# Patient Record
Sex: Male | Born: 1950 | Race: White | Hispanic: No | Marital: Married | Smoking: Never smoker
Health system: Southern US, Community
[De-identification: ages and names within clinical notes are randomized; demographics above are authoritative.]

## PROBLEM LIST (undated history)

## (undated) DIAGNOSIS — M1990 Unspecified osteoarthritis, unspecified site: Secondary | ICD-10-CM

## (undated) DIAGNOSIS — C61 Malignant neoplasm of prostate: Secondary | ICD-10-CM

## (undated) HISTORY — DX: Malignant neoplasm of prostate: C61

## (undated) HISTORY — DX: Unspecified osteoarthritis, unspecified site: M19.90

---

## 2016-02-08 DIAGNOSIS — Z23 Encounter for immunization: Secondary | ICD-10-CM | POA: Diagnosis not present

## 2016-03-07 DIAGNOSIS — L309 Dermatitis, unspecified: Secondary | ICD-10-CM | POA: Diagnosis not present

## 2016-03-07 DIAGNOSIS — L570 Actinic keratosis: Secondary | ICD-10-CM | POA: Diagnosis not present

## 2016-03-07 DIAGNOSIS — L578 Other skin changes due to chronic exposure to nonionizing radiation: Secondary | ICD-10-CM | POA: Diagnosis not present

## 2016-03-07 DIAGNOSIS — D045 Carcinoma in situ of skin of trunk: Secondary | ICD-10-CM | POA: Diagnosis not present

## 2016-03-25 DIAGNOSIS — Z86010 Personal history of colonic polyps: Secondary | ICD-10-CM | POA: Diagnosis not present

## 2016-04-24 DIAGNOSIS — D125 Benign neoplasm of sigmoid colon: Secondary | ICD-10-CM | POA: Diagnosis not present

## 2016-04-24 DIAGNOSIS — Z538 Procedure and treatment not carried out for other reasons: Secondary | ICD-10-CM | POA: Diagnosis not present

## 2016-04-24 DIAGNOSIS — Z87891 Personal history of nicotine dependence: Secondary | ICD-10-CM | POA: Diagnosis not present

## 2016-04-24 DIAGNOSIS — Z7982 Long term (current) use of aspirin: Secondary | ICD-10-CM | POA: Diagnosis not present

## 2016-04-24 DIAGNOSIS — Z79899 Other long term (current) drug therapy: Secondary | ICD-10-CM | POA: Diagnosis not present

## 2016-04-24 DIAGNOSIS — I10 Essential (primary) hypertension: Secondary | ICD-10-CM | POA: Diagnosis not present

## 2016-04-24 DIAGNOSIS — K635 Polyp of colon: Secondary | ICD-10-CM | POA: Diagnosis not present

## 2016-04-24 DIAGNOSIS — K6389 Other specified diseases of intestine: Secondary | ICD-10-CM | POA: Diagnosis not present

## 2016-04-24 DIAGNOSIS — D124 Benign neoplasm of descending colon: Secondary | ICD-10-CM | POA: Diagnosis not present

## 2016-04-24 DIAGNOSIS — Z1211 Encounter for screening for malignant neoplasm of colon: Secondary | ICD-10-CM | POA: Diagnosis not present

## 2016-05-06 DIAGNOSIS — D126 Benign neoplasm of colon, unspecified: Secondary | ICD-10-CM | POA: Diagnosis not present

## 2016-06-27 DIAGNOSIS — L570 Actinic keratosis: Secondary | ICD-10-CM | POA: Diagnosis not present

## 2016-07-02 DIAGNOSIS — I10 Essential (primary) hypertension: Secondary | ICD-10-CM | POA: Diagnosis not present

## 2016-07-02 DIAGNOSIS — Z125 Encounter for screening for malignant neoplasm of prostate: Secondary | ICD-10-CM | POA: Diagnosis not present

## 2016-07-02 DIAGNOSIS — E785 Hyperlipidemia, unspecified: Secondary | ICD-10-CM | POA: Diagnosis not present

## 2016-07-02 DIAGNOSIS — Z131 Encounter for screening for diabetes mellitus: Secondary | ICD-10-CM | POA: Diagnosis not present

## 2016-07-02 DIAGNOSIS — M159 Polyosteoarthritis, unspecified: Secondary | ICD-10-CM | POA: Diagnosis not present

## 2017-01-08 DIAGNOSIS — Z23 Encounter for immunization: Secondary | ICD-10-CM | POA: Diagnosis not present

## 2017-01-08 DIAGNOSIS — I10 Essential (primary) hypertension: Secondary | ICD-10-CM | POA: Diagnosis not present

## 2017-01-08 DIAGNOSIS — E785 Hyperlipidemia, unspecified: Secondary | ICD-10-CM | POA: Diagnosis not present

## 2017-01-26 DIAGNOSIS — L821 Other seborrheic keratosis: Secondary | ICD-10-CM | POA: Diagnosis not present

## 2017-01-26 DIAGNOSIS — Z85828 Personal history of other malignant neoplasm of skin: Secondary | ICD-10-CM | POA: Diagnosis not present

## 2017-01-26 DIAGNOSIS — L309 Dermatitis, unspecified: Secondary | ICD-10-CM | POA: Diagnosis not present

## 2017-01-26 DIAGNOSIS — L578 Other skin changes due to chronic exposure to nonionizing radiation: Secondary | ICD-10-CM | POA: Diagnosis not present

## 2017-01-26 DIAGNOSIS — L570 Actinic keratosis: Secondary | ICD-10-CM | POA: Diagnosis not present

## 2017-01-26 DIAGNOSIS — D225 Melanocytic nevi of trunk: Secondary | ICD-10-CM | POA: Diagnosis not present

## 2017-01-26 DIAGNOSIS — Z08 Encounter for follow-up examination after completed treatment for malignant neoplasm: Secondary | ICD-10-CM | POA: Diagnosis not present

## 2017-01-26 DIAGNOSIS — D485 Neoplasm of uncertain behavior of skin: Secondary | ICD-10-CM | POA: Diagnosis not present

## 2017-07-07 DIAGNOSIS — Z23 Encounter for immunization: Secondary | ICD-10-CM | POA: Diagnosis not present

## 2017-07-07 DIAGNOSIS — E785 Hyperlipidemia, unspecified: Secondary | ICD-10-CM | POA: Diagnosis not present

## 2017-07-07 DIAGNOSIS — I10 Essential (primary) hypertension: Secondary | ICD-10-CM | POA: Diagnosis not present

## 2017-07-23 DIAGNOSIS — E785 Hyperlipidemia, unspecified: Secondary | ICD-10-CM | POA: Diagnosis not present

## 2017-07-23 DIAGNOSIS — I10 Essential (primary) hypertension: Secondary | ICD-10-CM | POA: Diagnosis not present

## 2017-07-23 DIAGNOSIS — Z23 Encounter for immunization: Secondary | ICD-10-CM | POA: Diagnosis not present

## 2017-07-27 DIAGNOSIS — D225 Melanocytic nevi of trunk: Secondary | ICD-10-CM | POA: Diagnosis not present

## 2017-07-27 DIAGNOSIS — L578 Other skin changes due to chronic exposure to nonionizing radiation: Secondary | ICD-10-CM | POA: Diagnosis not present

## 2017-07-27 DIAGNOSIS — D234 Other benign neoplasm of skin of scalp and neck: Secondary | ICD-10-CM | POA: Diagnosis not present

## 2017-07-27 DIAGNOSIS — D485 Neoplasm of uncertain behavior of skin: Secondary | ICD-10-CM | POA: Diagnosis not present

## 2017-07-27 DIAGNOSIS — L249 Irritant contact dermatitis, unspecified cause: Secondary | ICD-10-CM | POA: Diagnosis not present

## 2017-07-27 DIAGNOSIS — Z08 Encounter for follow-up examination after completed treatment for malignant neoplasm: Secondary | ICD-10-CM | POA: Diagnosis not present

## 2017-07-27 DIAGNOSIS — Z85828 Personal history of other malignant neoplasm of skin: Secondary | ICD-10-CM | POA: Diagnosis not present

## 2018-01-26 DIAGNOSIS — M5090 Cervical disc disorder, unspecified, unspecified cervical region: Secondary | ICD-10-CM | POA: Diagnosis not present

## 2018-01-26 DIAGNOSIS — M1990 Unspecified osteoarthritis, unspecified site: Secondary | ICD-10-CM | POA: Diagnosis not present

## 2018-01-26 DIAGNOSIS — I10 Essential (primary) hypertension: Secondary | ICD-10-CM | POA: Diagnosis not present

## 2018-01-26 DIAGNOSIS — M2550 Pain in unspecified joint: Secondary | ICD-10-CM | POA: Diagnosis not present

## 2018-01-26 DIAGNOSIS — Z1389 Encounter for screening for other disorder: Secondary | ICD-10-CM | POA: Diagnosis not present

## 2018-01-26 DIAGNOSIS — E7800 Pure hypercholesterolemia, unspecified: Secondary | ICD-10-CM | POA: Diagnosis not present

## 2018-01-26 DIAGNOSIS — K649 Unspecified hemorrhoids: Secondary | ICD-10-CM | POA: Diagnosis not present

## 2018-01-26 DIAGNOSIS — Z23 Encounter for immunization: Secondary | ICD-10-CM | POA: Diagnosis not present

## 2018-02-02 DIAGNOSIS — H53032 Strabismic amblyopia, left eye: Secondary | ICD-10-CM | POA: Diagnosis not present

## 2018-02-02 DIAGNOSIS — H2513 Age-related nuclear cataract, bilateral: Secondary | ICD-10-CM | POA: Diagnosis not present

## 2018-02-02 DIAGNOSIS — H02413 Mechanical ptosis of bilateral eyelids: Secondary | ICD-10-CM | POA: Diagnosis not present

## 2018-02-02 DIAGNOSIS — H35363 Drusen (degenerative) of macula, bilateral: Secondary | ICD-10-CM | POA: Diagnosis not present

## 2018-02-04 DIAGNOSIS — Z6828 Body mass index (BMI) 28.0-28.9, adult: Secondary | ICD-10-CM | POA: Diagnosis not present

## 2018-02-04 DIAGNOSIS — M150 Primary generalized (osteo)arthritis: Secondary | ICD-10-CM | POA: Diagnosis not present

## 2018-02-04 DIAGNOSIS — E663 Overweight: Secondary | ICD-10-CM | POA: Diagnosis not present

## 2018-02-04 DIAGNOSIS — M2550 Pain in unspecified joint: Secondary | ICD-10-CM | POA: Diagnosis not present

## 2018-02-17 DIAGNOSIS — R42 Dizziness and giddiness: Secondary | ICD-10-CM | POA: Diagnosis not present

## 2018-02-17 DIAGNOSIS — M5090 Cervical disc disorder, unspecified, unspecified cervical region: Secondary | ICD-10-CM | POA: Diagnosis not present

## 2018-02-17 DIAGNOSIS — I10 Essential (primary) hypertension: Secondary | ICD-10-CM | POA: Diagnosis not present

## 2018-03-18 DIAGNOSIS — H8110 Benign paroxysmal vertigo, unspecified ear: Secondary | ICD-10-CM | POA: Diagnosis not present

## 2018-03-18 DIAGNOSIS — I10 Essential (primary) hypertension: Secondary | ICD-10-CM | POA: Diagnosis not present

## 2018-06-15 DIAGNOSIS — M5090 Cervical disc disorder, unspecified, unspecified cervical region: Secondary | ICD-10-CM | POA: Diagnosis not present

## 2018-06-15 DIAGNOSIS — K649 Unspecified hemorrhoids: Secondary | ICD-10-CM | POA: Diagnosis not present

## 2018-06-15 DIAGNOSIS — I10 Essential (primary) hypertension: Secondary | ICD-10-CM | POA: Diagnosis not present

## 2018-06-15 DIAGNOSIS — Z0000 Encounter for general adult medical examination without abnormal findings: Secondary | ICD-10-CM | POA: Diagnosis not present

## 2018-06-15 DIAGNOSIS — M1990 Unspecified osteoarthritis, unspecified site: Secondary | ICD-10-CM | POA: Diagnosis not present

## 2018-06-15 DIAGNOSIS — Z125 Encounter for screening for malignant neoplasm of prostate: Secondary | ICD-10-CM | POA: Diagnosis not present

## 2018-06-15 DIAGNOSIS — E7800 Pure hypercholesterolemia, unspecified: Secondary | ICD-10-CM | POA: Diagnosis not present

## 2018-06-15 DIAGNOSIS — Z1159 Encounter for screening for other viral diseases: Secondary | ICD-10-CM | POA: Diagnosis not present

## 2018-06-29 DIAGNOSIS — M2550 Pain in unspecified joint: Secondary | ICD-10-CM | POA: Diagnosis not present

## 2018-06-29 DIAGNOSIS — Z6829 Body mass index (BMI) 29.0-29.9, adult: Secondary | ICD-10-CM | POA: Diagnosis not present

## 2018-06-29 DIAGNOSIS — M150 Primary generalized (osteo)arthritis: Secondary | ICD-10-CM | POA: Diagnosis not present

## 2018-06-29 DIAGNOSIS — E663 Overweight: Secondary | ICD-10-CM | POA: Diagnosis not present

## 2018-06-29 DIAGNOSIS — R899 Unspecified abnormal finding in specimens from other organs, systems and tissues: Secondary | ICD-10-CM | POA: Diagnosis not present

## 2018-10-08 DIAGNOSIS — D485 Neoplasm of uncertain behavior of skin: Secondary | ICD-10-CM | POA: Diagnosis not present

## 2018-10-08 DIAGNOSIS — L309 Dermatitis, unspecified: Secondary | ICD-10-CM | POA: Diagnosis not present

## 2018-10-08 DIAGNOSIS — L570 Actinic keratosis: Secondary | ICD-10-CM | POA: Diagnosis not present

## 2018-10-08 DIAGNOSIS — Z85828 Personal history of other malignant neoplasm of skin: Secondary | ICD-10-CM | POA: Diagnosis not present

## 2018-10-08 DIAGNOSIS — L814 Other melanin hyperpigmentation: Secondary | ICD-10-CM | POA: Diagnosis not present

## 2018-10-08 DIAGNOSIS — C44519 Basal cell carcinoma of skin of other part of trunk: Secondary | ICD-10-CM | POA: Diagnosis not present

## 2018-10-08 DIAGNOSIS — D045 Carcinoma in situ of skin of trunk: Secondary | ICD-10-CM | POA: Diagnosis not present

## 2018-10-08 DIAGNOSIS — L821 Other seborrheic keratosis: Secondary | ICD-10-CM | POA: Diagnosis not present

## 2018-11-30 DIAGNOSIS — C44519 Basal cell carcinoma of skin of other part of trunk: Secondary | ICD-10-CM | POA: Diagnosis not present

## 2018-11-30 DIAGNOSIS — L988 Other specified disorders of the skin and subcutaneous tissue: Secondary | ICD-10-CM | POA: Diagnosis not present

## 2018-12-08 DIAGNOSIS — Z23 Encounter for immunization: Secondary | ICD-10-CM | POA: Diagnosis not present

## 2018-12-15 DIAGNOSIS — M549 Dorsalgia, unspecified: Secondary | ICD-10-CM | POA: Diagnosis not present

## 2018-12-15 DIAGNOSIS — E7800 Pure hypercholesterolemia, unspecified: Secondary | ICD-10-CM | POA: Diagnosis not present

## 2018-12-15 DIAGNOSIS — Z23 Encounter for immunization: Secondary | ICD-10-CM | POA: Diagnosis not present

## 2018-12-15 DIAGNOSIS — I10 Essential (primary) hypertension: Secondary | ICD-10-CM | POA: Diagnosis not present

## 2019-01-26 DIAGNOSIS — C44222 Squamous cell carcinoma of skin of right ear and external auricular canal: Secondary | ICD-10-CM | POA: Diagnosis not present

## 2019-01-26 DIAGNOSIS — D485 Neoplasm of uncertain behavior of skin: Secondary | ICD-10-CM | POA: Diagnosis not present

## 2019-01-26 DIAGNOSIS — L821 Other seborrheic keratosis: Secondary | ICD-10-CM | POA: Diagnosis not present

## 2019-01-26 DIAGNOSIS — Z86007 Personal history of in-situ neoplasm of skin: Secondary | ICD-10-CM | POA: Diagnosis not present

## 2019-01-26 DIAGNOSIS — L570 Actinic keratosis: Secondary | ICD-10-CM | POA: Diagnosis not present

## 2019-01-26 DIAGNOSIS — Z23 Encounter for immunization: Secondary | ICD-10-CM | POA: Diagnosis not present

## 2019-02-07 DIAGNOSIS — H2513 Age-related nuclear cataract, bilateral: Secondary | ICD-10-CM | POA: Diagnosis not present

## 2019-02-07 DIAGNOSIS — H35363 Drusen (degenerative) of macula, bilateral: Secondary | ICD-10-CM | POA: Diagnosis not present

## 2019-02-07 DIAGNOSIS — H35033 Hypertensive retinopathy, bilateral: Secondary | ICD-10-CM | POA: Diagnosis not present

## 2019-02-07 DIAGNOSIS — H02831 Dermatochalasis of right upper eyelid: Secondary | ICD-10-CM | POA: Diagnosis not present

## 2019-02-18 DIAGNOSIS — H57813 Brow ptosis, bilateral: Secondary | ICD-10-CM | POA: Diagnosis not present

## 2019-02-18 DIAGNOSIS — H02834 Dermatochalasis of left upper eyelid: Secondary | ICD-10-CM | POA: Diagnosis not present

## 2019-02-18 DIAGNOSIS — H02413 Mechanical ptosis of bilateral eyelids: Secondary | ICD-10-CM | POA: Diagnosis not present

## 2019-02-18 DIAGNOSIS — H0279 Other degenerative disorders of eyelid and periocular area: Secondary | ICD-10-CM | POA: Diagnosis not present

## 2019-02-18 DIAGNOSIS — H53483 Generalized contraction of visual field, bilateral: Secondary | ICD-10-CM | POA: Diagnosis not present

## 2019-02-18 DIAGNOSIS — L570 Actinic keratosis: Secondary | ICD-10-CM | POA: Diagnosis not present

## 2019-02-18 DIAGNOSIS — H02831 Dermatochalasis of right upper eyelid: Secondary | ICD-10-CM | POA: Diagnosis not present

## 2019-03-10 DIAGNOSIS — L814 Other melanin hyperpigmentation: Secondary | ICD-10-CM | POA: Diagnosis not present

## 2019-03-10 DIAGNOSIS — L578 Other skin changes due to chronic exposure to nonionizing radiation: Secondary | ICD-10-CM | POA: Diagnosis not present

## 2019-03-10 DIAGNOSIS — Z85828 Personal history of other malignant neoplasm of skin: Secondary | ICD-10-CM | POA: Diagnosis not present

## 2019-03-10 DIAGNOSIS — D1801 Hemangioma of skin and subcutaneous tissue: Secondary | ICD-10-CM | POA: Diagnosis not present

## 2019-03-10 DIAGNOSIS — L111 Transient acantholytic dermatosis [Grover]: Secondary | ICD-10-CM | POA: Diagnosis not present

## 2019-03-10 DIAGNOSIS — C44222 Squamous cell carcinoma of skin of right ear and external auricular canal: Secondary | ICD-10-CM | POA: Diagnosis not present

## 2019-03-10 DIAGNOSIS — D239 Other benign neoplasm of skin, unspecified: Secondary | ICD-10-CM | POA: Diagnosis not present

## 2019-03-10 DIAGNOSIS — L821 Other seborrheic keratosis: Secondary | ICD-10-CM | POA: Diagnosis not present

## 2019-03-10 DIAGNOSIS — L570 Actinic keratosis: Secondary | ICD-10-CM | POA: Diagnosis not present

## 2019-03-10 DIAGNOSIS — C44629 Squamous cell carcinoma of skin of left upper limb, including shoulder: Secondary | ICD-10-CM | POA: Diagnosis not present

## 2019-03-10 DIAGNOSIS — D485 Neoplasm of uncertain behavior of skin: Secondary | ICD-10-CM | POA: Diagnosis not present

## 2019-03-11 DIAGNOSIS — Z23 Encounter for immunization: Secondary | ICD-10-CM | POA: Diagnosis not present

## 2019-04-08 DIAGNOSIS — Z23 Encounter for immunization: Secondary | ICD-10-CM | POA: Diagnosis not present

## 2019-04-13 DIAGNOSIS — H02834 Dermatochalasis of left upper eyelid: Secondary | ICD-10-CM | POA: Diagnosis not present

## 2019-04-13 DIAGNOSIS — H0279 Other degenerative disorders of eyelid and periocular area: Secondary | ICD-10-CM | POA: Diagnosis not present

## 2019-04-13 DIAGNOSIS — H02413 Mechanical ptosis of bilateral eyelids: Secondary | ICD-10-CM | POA: Diagnosis not present

## 2019-04-13 DIAGNOSIS — H57813 Brow ptosis, bilateral: Secondary | ICD-10-CM | POA: Diagnosis not present

## 2019-04-13 DIAGNOSIS — H53483 Generalized contraction of visual field, bilateral: Secondary | ICD-10-CM | POA: Diagnosis not present

## 2019-04-13 DIAGNOSIS — H02831 Dermatochalasis of right upper eyelid: Secondary | ICD-10-CM | POA: Diagnosis not present

## 2019-04-13 DIAGNOSIS — L570 Actinic keratosis: Secondary | ICD-10-CM | POA: Diagnosis not present

## 2019-04-20 DIAGNOSIS — C44222 Squamous cell carcinoma of skin of right ear and external auricular canal: Secondary | ICD-10-CM | POA: Diagnosis not present

## 2019-04-21 ENCOUNTER — Ambulatory Visit (INDEPENDENT_AMBULATORY_CARE_PROVIDER_SITE_OTHER): Payer: Medicare Other

## 2019-04-21 ENCOUNTER — Other Ambulatory Visit: Payer: Self-pay

## 2019-04-21 ENCOUNTER — Ambulatory Visit (INDEPENDENT_AMBULATORY_CARE_PROVIDER_SITE_OTHER): Payer: Medicare Other | Admitting: Orthopaedic Surgery

## 2019-04-21 ENCOUNTER — Encounter: Payer: Self-pay | Admitting: Orthopaedic Surgery

## 2019-04-21 DIAGNOSIS — M25551 Pain in right hip: Secondary | ICD-10-CM

## 2019-04-21 DIAGNOSIS — G8929 Other chronic pain: Secondary | ICD-10-CM | POA: Diagnosis not present

## 2019-04-21 DIAGNOSIS — M25562 Pain in left knee: Secondary | ICD-10-CM

## 2019-04-21 DIAGNOSIS — M25552 Pain in left hip: Secondary | ICD-10-CM | POA: Diagnosis not present

## 2019-04-21 MED ORDER — LIDOCAINE HCL 1 % IJ SOLN
3.0000 mL | INTRAMUSCULAR | Status: AC | PRN
  Administered 2019-04-21: 3 mL

## 2019-04-21 MED ORDER — METHYLPREDNISOLONE ACETATE 40 MG/ML IJ SUSP
40.0000 mg | INTRAMUSCULAR | Status: AC | PRN
  Administered 2019-04-21: 40 mg via INTRA_ARTICULAR

## 2019-04-21 NOTE — Progress Notes (Signed)
Office Visit Note   Patient: Christian Brooks           Date of Birth: 1950/09/27           MRN: [id number] Visit Date: 04/21/2019              Requested by: No referring provider defined for this encounter. PCP: Patient, No Pcp Per   Assessment & Plan: Visit Diagnoses:  1. Chronic pain of left knee   2. Bilateral hip pain     Plan: We talked about his hips and left knee in detail.  I do feel it is worthwhile trying a steroid injection of left knee the could hopefully be a diagnostic and therapeutic injection.  He agrees with this treatment plan.  My goal would be to decrease his pain and improve the function of that knee.  If the injection works it will be great.  If it works but then he has residual pain it would warrant a MRI of his left knee to assess the cartilage and look for any meniscal tearing based on his clinical exam findings.  As far as his hips goes, right now would not recommend anything else but will consider intra-articular injections in the hips at some point if needed.  He agrees with this treatment plan.  All questions and concerns were answered addressed.  He did tolerate the steroid injection well in his left knee.  We will see how he does with this and will see him back in 2 weeks.  Follow-Up Instructions: Return in about 2 weeks (around 05/05/2019).   Orders:  Orders Placed This Encounter  Procedures  . Large Joint Inj: L knee  . XR HIPS BILAT W OR W/O PELVIS 3-4 VIEWS  . XR KNEE 3 VIEW LEFT   No orders of the defined types were placed in this encounter.     Procedures: Large Joint Inj: L knee on 04/21/2019 3:41 PM Indications: diagnostic evaluation and pain Details: 22 G 1.5 in needle, superolateral approach  Arthrogram: No  Medications: 3 mL lidocaine 1 %; 40 mg methylPREDNISolone acetate 40 MG/ML Outcome: tolerated well, no immediate complications Procedure, treatment alternatives, risks and benefits explained, specific risks discussed. Consent was  given by the patient. Immediately prior to procedure a time out was called to verify the correct patient, procedure, equipment, support staff and site/side marked as required. Patient was prepped and draped in the usual sterile fashion.       Clinical Data: No additional findings.   Subjective: Chief Complaint  Patient presents with  . Left Knee - Pain  The patient is a very pleasant and active 69 year old gentleman who has recently moved to [place] from [place] this past October.  He used to be very active but with COVID-19 pandemic he has not been able to get back into activities such as working on a gym.  He comes in with chief complaint of bilateral hip pain and left knee pain.  He does have a remote history of at least 2 different surgeries on his right knee but no surgery or injections in the left knee.  He states his left knee does have a lot of cracking and popping any points to the posterior medial aspect of his left knee is where he hurts the most.  Certain pivoting activities with that left knee does cause significant pain.  He also gets a lot of swelling in his left knee and he has difficulty putting his shoes and socks  on on the left side.  He also points to both his hips as a source of pain with right worse than left and he says is somewhat in the groin but he really points to the superior lateral aspect of both hips as a source of his pain.  He says is worse when he standing after sitting for a while.  This is true with his knee as well.  He is not a diabetic and not a smoker and otherwise very active individual that is frustrated at the stiffness and pain that he is getting.  HPI  Review of Systems He currently denies any headache, chest pain, shortness of breath, fever, chills, nausea, vomiting  Objective: Vital Signs: There were no vitals taken for this visit.  Physical Exam He is alert and orient x3 and in no acute distress Ortho Exam Examination of both his hip  shows full and fluid range of motion of both hips with no blocks to rotation and no pain in the groin today with either hip and no pain to palpation of the trochanteric area with either hip.  Examination of his left knee today shows no effusion.  There is pain to the medial and posterior medial aspect of the left knee and stressing and rotating the tibia on the femur does cause pain in this area.  There is some slight patellofemoral crepitation but good range of motion of the knee.  His Lachman's exam is negative. Specialty Comments:  No specialty comments available.  Imaging: XR HIPS BILAT W OR W/O PELVIS 3-4 VIEWS  Result Date: 04/21/2019 A low AP pelvis and lateral of both hips shows no significant arthritic changes or acute findings.  The femoral heads are nice and round of the joint space is well-maintained with both hips.  XR KNEE 3 VIEW LEFT  Result Date: 04/21/2019 3 views of the left knee shows well-maintained medial lateral compartments.  There is mild to moderate patellofemoral arthritic changes.  There is no malalignment and no effusion.  There is otherwise no acute findings.    PMFS History: There are no problems to display for this patient.  History reviewed. No pertinent past medical history.  History reviewed. No pertinent family history.  History reviewed. No pertinent surgical history. Social History   Occupational History  . Not on file  Tobacco Use  . Smoking status: Never Smoker  . Smokeless tobacco: Never Used  Substance and Sexual Activity  . Alcohol use: Not on file  . Drug use: Not on file  . Sexual activity: Not on file

## 2019-04-26 DIAGNOSIS — C44629 Squamous cell carcinoma of skin of left upper limb, including shoulder: Secondary | ICD-10-CM | POA: Diagnosis not present

## 2019-04-26 DIAGNOSIS — L821 Other seborrheic keratosis: Secondary | ICD-10-CM | POA: Diagnosis not present

## 2019-05-05 ENCOUNTER — Encounter: Payer: Self-pay | Admitting: Orthopaedic Surgery

## 2019-05-05 ENCOUNTER — Other Ambulatory Visit: Payer: Self-pay

## 2019-05-05 ENCOUNTER — Ambulatory Visit (INDEPENDENT_AMBULATORY_CARE_PROVIDER_SITE_OTHER): Payer: Medicare Other | Admitting: Orthopaedic Surgery

## 2019-05-05 DIAGNOSIS — M25562 Pain in left knee: Secondary | ICD-10-CM

## 2019-05-05 DIAGNOSIS — G8929 Other chronic pain: Secondary | ICD-10-CM

## 2019-05-05 NOTE — Progress Notes (Signed)
The patient is a 69 year old gentleman I am seeing in follow-up after placing a steroid injection in his left knee due to acute knee issues.  He was having a lot of pain in the back of his knee with pivoting activities.  It is not hurt going up and down stairs.  It does occasionally wake him up at night if he had his knee bent twisted a different way.  It hurts when he is getting out of a car.  He is very guarded now with his knee.  Its only the posterior medial lateral aspect of his knee where it hurts quite a bit.  The steroid injection did calm down his pain quite a bit until just recently when is hurting again.  Even today he is visibly walking with a limp.  He has never had surgery on his left knee before.  Previous x-rays of his left knee at his last visit show well-maintained medial lateral compartments with no acute findings.  Examination of his left knee today shows no effusion but he has significant pain with internal and external rotation of the tibia on the femur with a positive Murray sign to the medial compartment.  His Lachman's exam is negative.  His range of motion is full.  Given his failure conservative treatment including quad strengthening exercises, activity modification and rest, anti-inflammatories both topically and orally as well as a steroid injection, I feel is warranted we obtain an MRI of the left knee to rule out a meniscal tear.  He is also follow an exercise program involving quad strengthening.  He agrees with this treatment plan.  We will see him back once we have the MRI of his left knee and can go over this with him.

## 2019-05-19 ENCOUNTER — Ambulatory Visit: Payer: Medicare Other | Admitting: Orthopaedic Surgery

## 2019-05-28 ENCOUNTER — Ambulatory Visit
Admission: RE | Admit: 2019-05-28 | Discharge: 2019-05-28 | Disposition: A | Discharge status: Home / Self Care | Payer: Medicare Other | Source: Ambulatory Visit | Attending: Orthopaedic Surgery | Admitting: Orthopaedic Surgery

## 2019-05-28 ENCOUNTER — Other Ambulatory Visit: Payer: Self-pay

## 2019-05-28 DIAGNOSIS — G8929 Other chronic pain: Secondary | ICD-10-CM

## 2019-05-28 DIAGNOSIS — M25562 Pain in left knee: Secondary | ICD-10-CM | POA: Diagnosis not present

## 2019-05-28 IMAGING — MR MR KNEE*L* W/O CM
7 series · 38 of 40 positions shown · non-contrast
Comparison: Plain films left knee from [REDACTED] [HOSPITAL]
[DATE].

CLINICAL DATA: Left knee pain for 3 months since a twisting injury
on stairs.

EXAM:
MRI OF THE LEFT KNEE WITHOUT CONTRAST
TECHNIQUE: Multiplanar, multisequence MR imaging of the knee was performed. No
intravenous contrast was administered.

[Series 6: T2 fat-sat · axial · left · 4.0mm · 0.50mm/px · z∈[-126,+27]mm · 6 of 36 slices shown (1 of 3)]
[im 1/36]
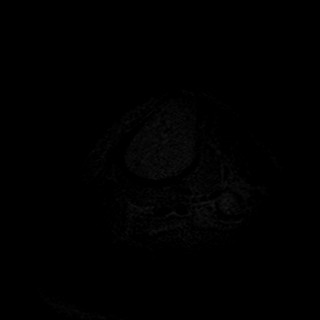
[im 8/36]
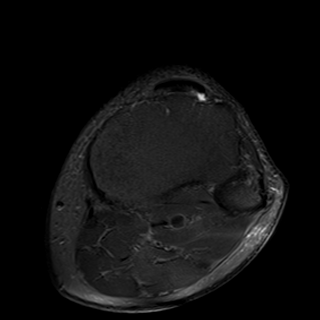
[im 15/36]
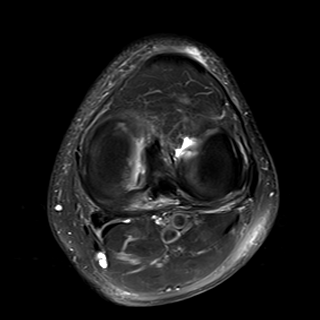
[im 22/36]
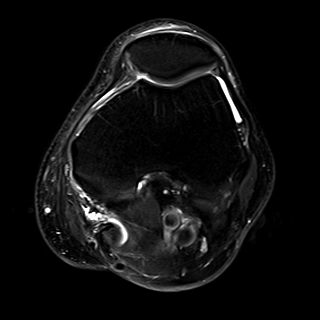
[im 29/36]
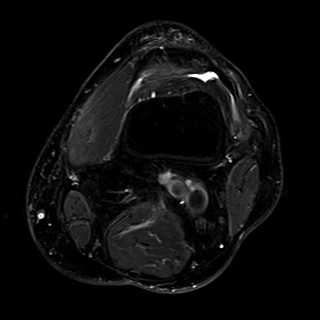
[im 36/36]
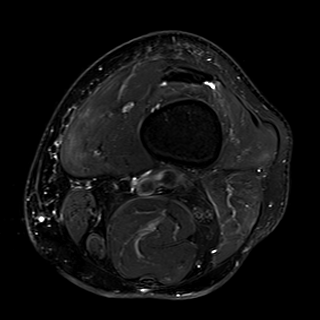

[Series 7: T2 fat-sat · coronal · left · 4.0mm · 0.37mm/px · 6 of 27 slices shown (2 of 3)]
[im 1/27]
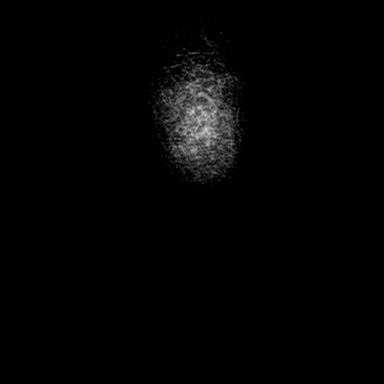
[im 6/27]
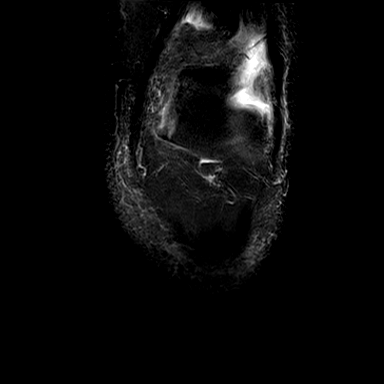
[im 11/27]
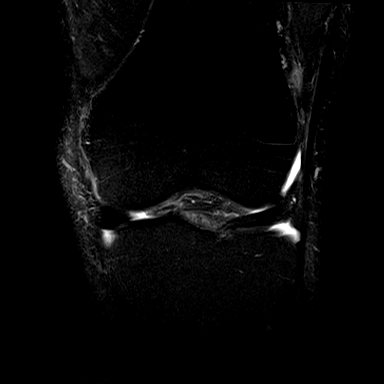
[im 16/27]
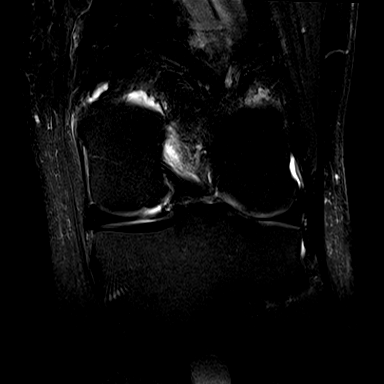
[im 21/27]
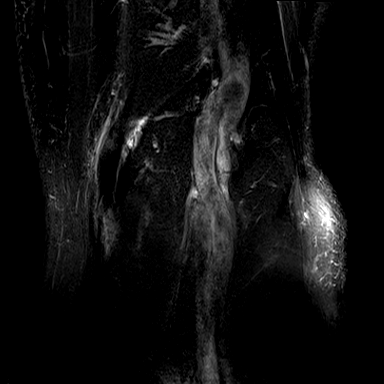
[im 27/27]
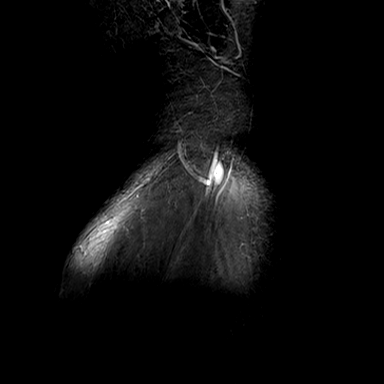

[Series 8: T1 · coronal · left · 4.0mm · 0.37mm/px · 4 of 28 slices shown]
[im 1/28]
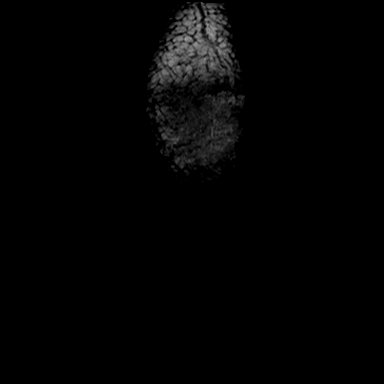
[im 6/28]
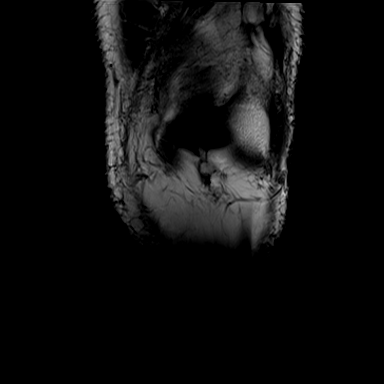
[im 11/28]
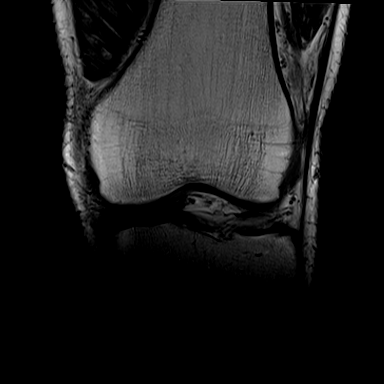
[im 17/28]
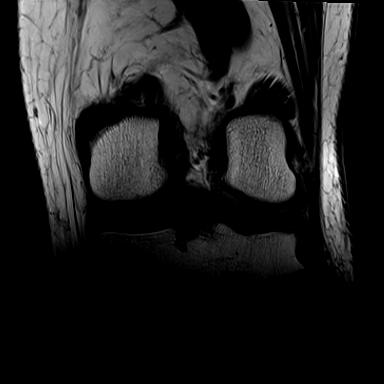

[Series 9: PD fat-sat · coronal · left · 3.0mm · 0.45mm/px · 6 of 28 slices shown (1 of 2)]
[im 1/28]
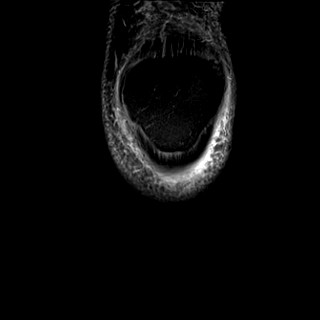
[im 6/28]
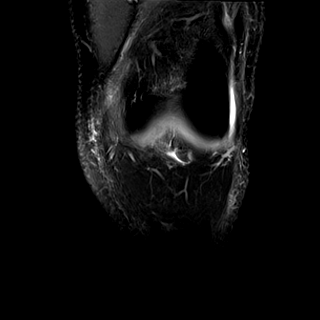
[im 11/28]
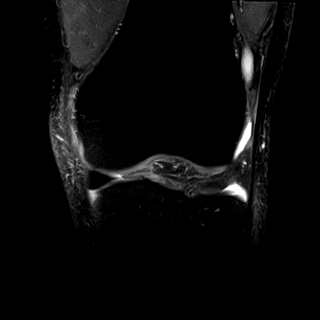
[im 17/28]
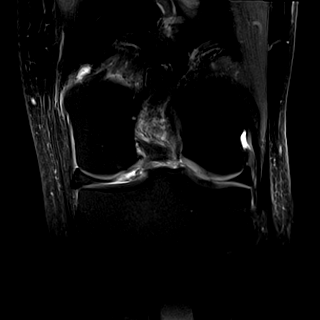
[im 22/28]
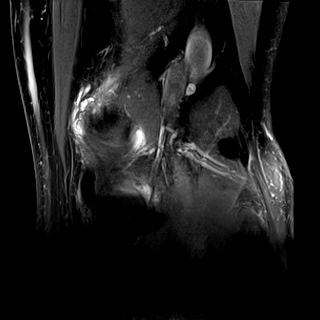
[im 28/28]
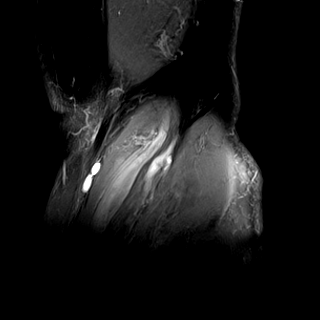

[Series 10: PD fat-sat · sagittal · left · 3.0mm · 0.39mm/px · 6 of 27 slices shown (2 of 2)]
[im 1/27]
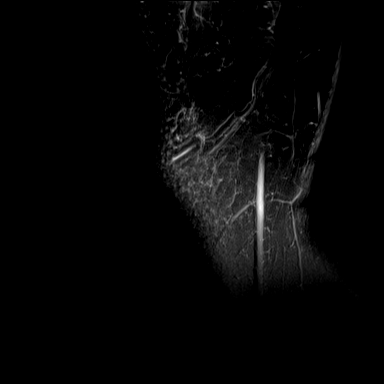
[im 6/27]
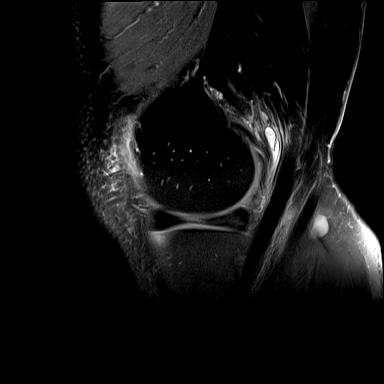
[im 11/27]
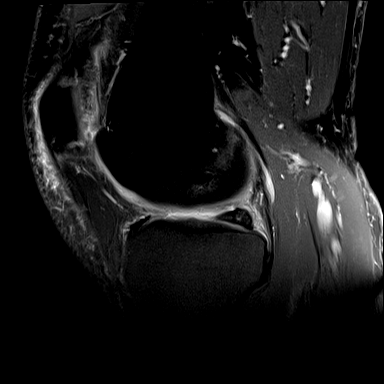
[im 16/27]
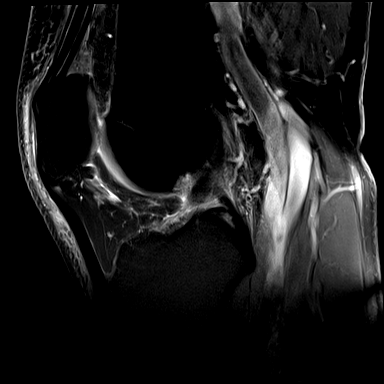
[im 21/27]
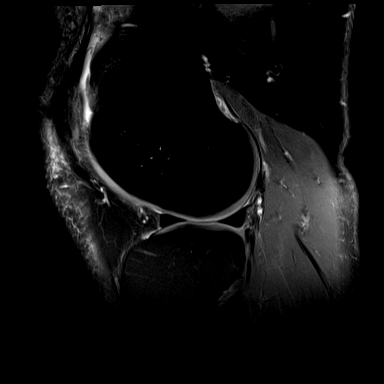
[im 27/27]
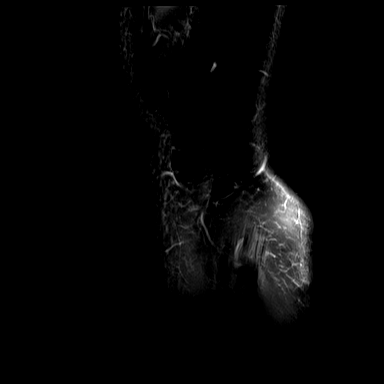

[Series 11: T2 fat-sat · sagittal · left · 3.0mm · 0.39mm/px · 6 of 27 slices shown (3 of 3)]
[im 1/27]
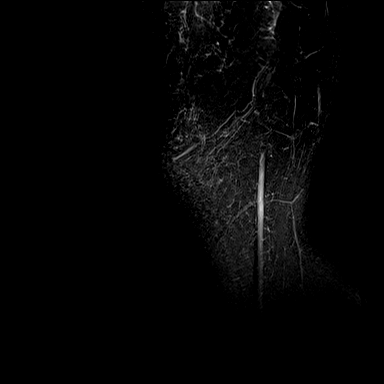
[im 6/27]
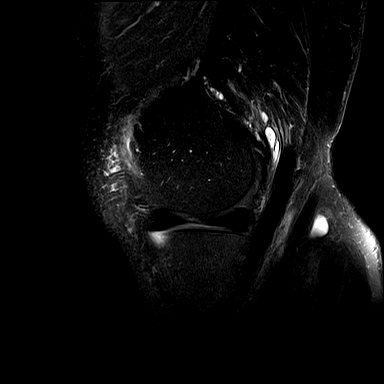
[im 11/27]
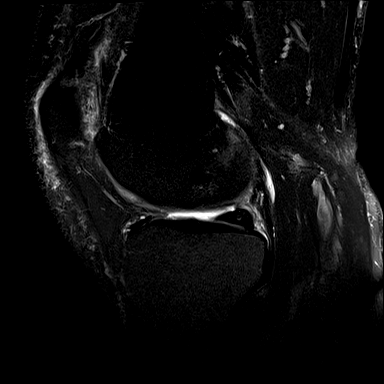
[im 16/27]
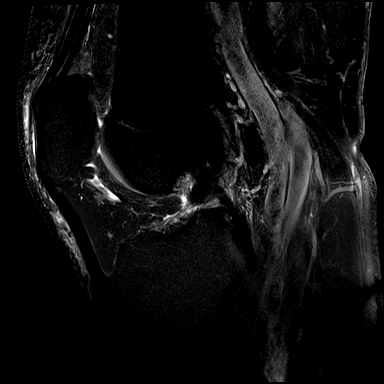
[im 21/27]
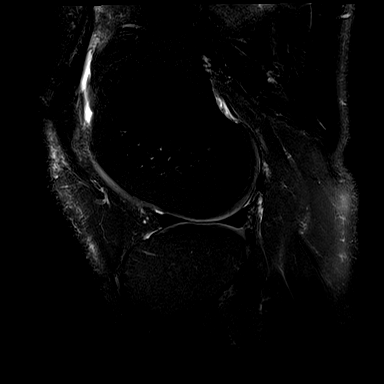
[im 27/27]
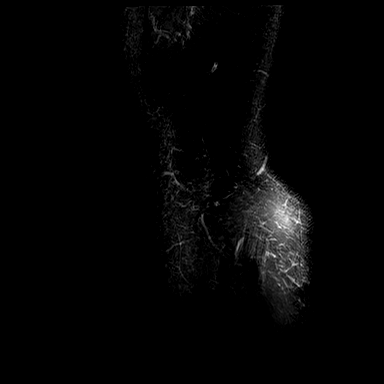

[Series 12: PD · coronal · left · 1.5mm · 0.44mm/px · 4 of 21 slices shown]
[im 1/21]
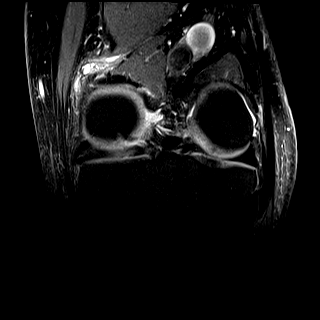
[im 7/21]
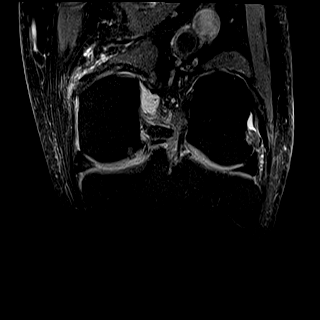
[im 14/21]
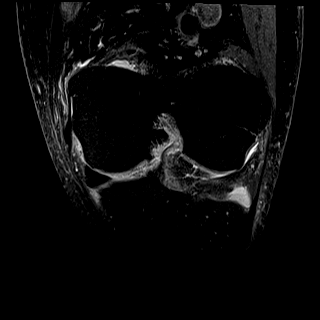
[im 21/21]
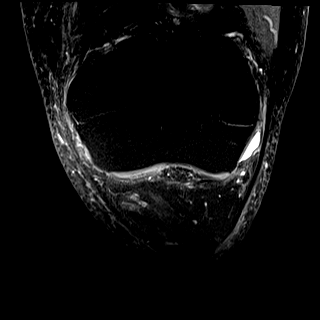

[38 of 40 positions shown; findings below may reference images not displayed]

FINDINGS: MENISCI

Medial meniscus: There is a partial radial tear through the root of
the posterior horn. The tear extends peripherally into the posterior
horn in a horizontal orientation reaching the meniscal undersurface.

Lateral meniscus:  Intact.

LIGAMENTS

Cruciates:  Intact.

Collaterals:  Intact.

CARTILAGE

Patellofemoral: Cartilage thinning is most notable along the medial
patellar facet in the superior pole.

Medial:  Mildly degenerated.

Lateral:  Minimally degenerated.

Joint:  Very small effusion.

Popliteal Fossa:  Small Baker's cyst.

Extensor Mechanism:  Intact

Bones:  No fracture, stress change or worrisome lesion

Other: None.
IMPRESSION: Tear of the posterior horn the medial meniscus as described above.

Mild medial and patellofemoral degenerative change.

## 2019-05-31 ENCOUNTER — Encounter: Payer: Self-pay | Admitting: Orthopaedic Surgery

## 2019-05-31 ENCOUNTER — Other Ambulatory Visit: Payer: Self-pay

## 2019-05-31 ENCOUNTER — Ambulatory Visit (INDEPENDENT_AMBULATORY_CARE_PROVIDER_SITE_OTHER): Payer: Medicare Other | Admitting: Orthopaedic Surgery

## 2019-05-31 DIAGNOSIS — S83242D Other tear of medial meniscus, current injury, left knee, subsequent encounter: Secondary | ICD-10-CM

## 2019-05-31 NOTE — Progress Notes (Signed)
The patient comes in today to go over MRI of his left knee.  He is a very active 69 year old could been having some knee pain with pivoting activities.  We did place a steroid injection in his knee in early April and that did help somewhat.  He then felt like he may have injured the knee again.  He does play golf and that does bother his knee.  It does feel better than it had been.  His plain films showed a well-maintained space in his left knee.  On exam today his main pain is in the medial joint line.  His McMurray's and Lachman's are negative.  There is no effusion his range of motion is full.  The knee is ligamentously stable.  The MRI does show a tear of the meniscal root with a horizontal component of the left knee medial meniscus.  He has minimal thinning of the cartilage in his knee.  The remaining structures are intact.  A long and thorough discussion about his knee and it showed him a knee model.  Right now would advocate just him strengthen the quad muscles and avoiding pivoting activities.  We can always repeat a steroid injection in July.  I did talk to him about the possibility of an arthroscopic intervention if his knee becomes more symptomatic but I would defer that for now since he is not having locking and catching.  I did explain what knee arthroscopy involves showing the knee model and we talked about that in detail.  All questions and concerns were answered addressed.  If he gets to the point where an arthroscopic intervention is warranted he will let us know.

## 2019-06-29 DIAGNOSIS — M150 Primary generalized (osteo)arthritis: Secondary | ICD-10-CM | POA: Diagnosis not present

## 2019-06-29 DIAGNOSIS — M2550 Pain in unspecified joint: Secondary | ICD-10-CM | POA: Diagnosis not present

## 2019-06-29 DIAGNOSIS — E663 Overweight: Secondary | ICD-10-CM | POA: Diagnosis not present

## 2019-06-29 DIAGNOSIS — R768 Other specified abnormal immunological findings in serum: Secondary | ICD-10-CM | POA: Diagnosis not present

## 2019-06-29 DIAGNOSIS — Z6829 Body mass index (BMI) 29.0-29.9, adult: Secondary | ICD-10-CM | POA: Diagnosis not present

## 2019-08-03 DIAGNOSIS — H02831 Dermatochalasis of right upper eyelid: Secondary | ICD-10-CM | POA: Diagnosis not present

## 2019-08-03 DIAGNOSIS — H02834 Dermatochalasis of left upper eyelid: Secondary | ICD-10-CM | POA: Diagnosis not present

## 2019-08-03 DIAGNOSIS — Z09 Encounter for follow-up examination after completed treatment for conditions other than malignant neoplasm: Secondary | ICD-10-CM | POA: Diagnosis not present

## 2019-09-14 DIAGNOSIS — K649 Unspecified hemorrhoids: Secondary | ICD-10-CM | POA: Diagnosis not present

## 2019-09-14 DIAGNOSIS — Z86010 Personal history of colonic polyps: Secondary | ICD-10-CM | POA: Diagnosis not present

## 2019-09-14 DIAGNOSIS — K5909 Other constipation: Secondary | ICD-10-CM | POA: Diagnosis not present

## 2019-10-03 DIAGNOSIS — R972 Elevated prostate specific antigen [PSA]: Secondary | ICD-10-CM | POA: Diagnosis not present

## 2019-11-04 DIAGNOSIS — N400 Enlarged prostate without lower urinary tract symptoms: Secondary | ICD-10-CM | POA: Diagnosis not present

## 2019-11-04 DIAGNOSIS — R972 Elevated prostate specific antigen [PSA]: Secondary | ICD-10-CM | POA: Diagnosis not present

## 2019-11-25 DIAGNOSIS — Z23 Encounter for immunization: Secondary | ICD-10-CM | POA: Diagnosis not present

## 2019-11-30 DIAGNOSIS — Z1159 Encounter for screening for other viral diseases: Secondary | ICD-10-CM | POA: Diagnosis not present

## 2019-12-05 DIAGNOSIS — Z86010 Personal history of colonic polyps: Secondary | ICD-10-CM | POA: Diagnosis not present

## 2019-12-05 DIAGNOSIS — K648 Other hemorrhoids: Secondary | ICD-10-CM | POA: Diagnosis not present

## 2019-12-05 DIAGNOSIS — Q438 Other specified congenital malformations of intestine: Secondary | ICD-10-CM | POA: Diagnosis not present

## 2019-12-05 DIAGNOSIS — D122 Benign neoplasm of ascending colon: Secondary | ICD-10-CM | POA: Diagnosis not present

## 2019-12-05 DIAGNOSIS — D120 Benign neoplasm of cecum: Secondary | ICD-10-CM | POA: Diagnosis not present

## 2019-12-05 DIAGNOSIS — K644 Residual hemorrhoidal skin tags: Secondary | ICD-10-CM | POA: Diagnosis not present

## 2019-12-05 LAB — HM COLONOSCOPY

## 2019-12-07 DIAGNOSIS — D122 Benign neoplasm of ascending colon: Secondary | ICD-10-CM | POA: Diagnosis not present

## 2019-12-07 DIAGNOSIS — D120 Benign neoplasm of cecum: Secondary | ICD-10-CM | POA: Diagnosis not present

## 2019-12-23 DIAGNOSIS — R972 Elevated prostate specific antigen [PSA]: Secondary | ICD-10-CM | POA: Diagnosis not present

## 2020-01-04 DIAGNOSIS — R972 Elevated prostate specific antigen [PSA]: Secondary | ICD-10-CM | POA: Diagnosis not present

## 2020-01-04 DIAGNOSIS — N400 Enlarged prostate without lower urinary tract symptoms: Secondary | ICD-10-CM | POA: Diagnosis not present

## 2020-01-11 DIAGNOSIS — I10 Essential (primary) hypertension: Secondary | ICD-10-CM | POA: Diagnosis not present

## 2020-01-11 DIAGNOSIS — E785 Hyperlipidemia, unspecified: Secondary | ICD-10-CM | POA: Diagnosis not present

## 2020-01-11 DIAGNOSIS — K635 Polyp of colon: Secondary | ICD-10-CM | POA: Diagnosis not present

## 2020-01-11 DIAGNOSIS — D122 Benign neoplasm of ascending colon: Secondary | ICD-10-CM | POA: Diagnosis not present

## 2020-01-11 LAB — HM COLONOSCOPY

## 2020-01-12 DIAGNOSIS — R972 Elevated prostate specific antigen [PSA]: Secondary | ICD-10-CM | POA: Diagnosis not present

## 2020-01-12 DIAGNOSIS — E7800 Pure hypercholesterolemia, unspecified: Secondary | ICD-10-CM | POA: Diagnosis not present

## 2020-01-12 DIAGNOSIS — I10 Essential (primary) hypertension: Secondary | ICD-10-CM | POA: Diagnosis not present

## 2020-01-12 DIAGNOSIS — Z23 Encounter for immunization: Secondary | ICD-10-CM | POA: Diagnosis not present

## 2020-03-08 DIAGNOSIS — L821 Other seborrheic keratosis: Secondary | ICD-10-CM | POA: Diagnosis not present

## 2020-03-08 DIAGNOSIS — L570 Actinic keratosis: Secondary | ICD-10-CM | POA: Diagnosis not present

## 2020-03-08 DIAGNOSIS — C44622 Squamous cell carcinoma of skin of right upper limb, including shoulder: Secondary | ICD-10-CM | POA: Diagnosis not present

## 2020-03-08 DIAGNOSIS — L814 Other melanin hyperpigmentation: Secondary | ICD-10-CM | POA: Diagnosis not present

## 2020-03-08 DIAGNOSIS — L111 Transient acantholytic dermatosis [Grover]: Secondary | ICD-10-CM | POA: Diagnosis not present

## 2020-03-08 DIAGNOSIS — Z85828 Personal history of other malignant neoplasm of skin: Secondary | ICD-10-CM | POA: Diagnosis not present

## 2020-03-08 DIAGNOSIS — D485 Neoplasm of uncertain behavior of skin: Secondary | ICD-10-CM | POA: Diagnosis not present

## 2020-03-08 DIAGNOSIS — L578 Other skin changes due to chronic exposure to nonionizing radiation: Secondary | ICD-10-CM | POA: Diagnosis not present

## 2020-04-16 DIAGNOSIS — L988 Other specified disorders of the skin and subcutaneous tissue: Secondary | ICD-10-CM | POA: Diagnosis not present

## 2020-04-16 DIAGNOSIS — C44529 Squamous cell carcinoma of skin of other part of trunk: Secondary | ICD-10-CM | POA: Diagnosis not present

## 2020-06-18 DIAGNOSIS — E7800 Pure hypercholesterolemia, unspecified: Secondary | ICD-10-CM | POA: Diagnosis not present

## 2020-06-18 DIAGNOSIS — Z1389 Encounter for screening for other disorder: Secondary | ICD-10-CM | POA: Diagnosis not present

## 2020-06-18 DIAGNOSIS — I10 Essential (primary) hypertension: Secondary | ICD-10-CM | POA: Diagnosis not present

## 2020-06-18 DIAGNOSIS — M1990 Unspecified osteoarthritis, unspecified site: Secondary | ICD-10-CM | POA: Diagnosis not present

## 2020-06-18 DIAGNOSIS — R972 Elevated prostate specific antigen [PSA]: Secondary | ICD-10-CM | POA: Diagnosis not present

## 2020-06-18 DIAGNOSIS — Z0000 Encounter for general adult medical examination without abnormal findings: Secondary | ICD-10-CM | POA: Diagnosis not present

## 2020-06-25 DIAGNOSIS — R972 Elevated prostate specific antigen [PSA]: Secondary | ICD-10-CM | POA: Diagnosis not present

## 2020-07-02 DIAGNOSIS — R972 Elevated prostate specific antigen [PSA]: Secondary | ICD-10-CM | POA: Diagnosis not present

## 2020-07-02 DIAGNOSIS — N400 Enlarged prostate without lower urinary tract symptoms: Secondary | ICD-10-CM | POA: Diagnosis not present

## 2020-07-10 DIAGNOSIS — R102 Pelvic and perineal pain: Secondary | ICD-10-CM | POA: Diagnosis not present

## 2020-07-10 DIAGNOSIS — N400 Enlarged prostate without lower urinary tract symptoms: Secondary | ICD-10-CM | POA: Diagnosis not present

## 2020-07-10 DIAGNOSIS — R8279 Other abnormal findings on microbiological examination of urine: Secondary | ICD-10-CM | POA: Diagnosis not present

## 2020-07-10 DIAGNOSIS — K6389 Other specified diseases of intestine: Secondary | ICD-10-CM | POA: Diagnosis not present

## 2020-07-10 DIAGNOSIS — N281 Cyst of kidney, acquired: Secondary | ICD-10-CM | POA: Diagnosis not present

## 2020-07-10 DIAGNOSIS — R3129 Other microscopic hematuria: Secondary | ICD-10-CM | POA: Diagnosis not present

## 2020-07-10 DIAGNOSIS — R3121 Asymptomatic microscopic hematuria: Secondary | ICD-10-CM | POA: Diagnosis not present

## 2020-07-10 DIAGNOSIS — R3915 Urgency of urination: Secondary | ICD-10-CM | POA: Diagnosis not present

## 2020-08-07 DIAGNOSIS — N400 Enlarged prostate without lower urinary tract symptoms: Secondary | ICD-10-CM | POA: Diagnosis not present

## 2020-08-24 DIAGNOSIS — H25041 Posterior subcapsular polar age-related cataract, right eye: Secondary | ICD-10-CM | POA: Diagnosis not present

## 2020-08-24 DIAGNOSIS — H25013 Cortical age-related cataract, bilateral: Secondary | ICD-10-CM | POA: Diagnosis not present

## 2020-08-24 DIAGNOSIS — H35363 Drusen (degenerative) of macula, bilateral: Secondary | ICD-10-CM | POA: Diagnosis not present

## 2020-08-24 DIAGNOSIS — H2513 Age-related nuclear cataract, bilateral: Secondary | ICD-10-CM | POA: Diagnosis not present

## 2020-09-06 DIAGNOSIS — Z85828 Personal history of other malignant neoplasm of skin: Secondary | ICD-10-CM | POA: Diagnosis not present

## 2020-09-06 DIAGNOSIS — L570 Actinic keratosis: Secondary | ICD-10-CM | POA: Diagnosis not present

## 2020-09-06 DIAGNOSIS — L578 Other skin changes due to chronic exposure to nonionizing radiation: Secondary | ICD-10-CM | POA: Diagnosis not present

## 2020-09-06 DIAGNOSIS — L111 Transient acantholytic dermatosis [Grover]: Secondary | ICD-10-CM | POA: Diagnosis not present

## 2020-09-06 DIAGNOSIS — L821 Other seborrheic keratosis: Secondary | ICD-10-CM | POA: Diagnosis not present

## 2020-09-06 DIAGNOSIS — L814 Other melanin hyperpigmentation: Secondary | ICD-10-CM | POA: Diagnosis not present

## 2020-10-29 DIAGNOSIS — Z23 Encounter for immunization: Secondary | ICD-10-CM | POA: Diagnosis not present

## 2020-11-19 DIAGNOSIS — Z20822 Contact with and (suspected) exposure to covid-19: Secondary | ICD-10-CM | POA: Diagnosis not present

## 2020-12-18 DIAGNOSIS — M1990 Unspecified osteoarthritis, unspecified site: Secondary | ICD-10-CM | POA: Diagnosis not present

## 2020-12-18 DIAGNOSIS — R319 Hematuria, unspecified: Secondary | ICD-10-CM | POA: Diagnosis not present

## 2020-12-18 DIAGNOSIS — I10 Essential (primary) hypertension: Secondary | ICD-10-CM | POA: Diagnosis not present

## 2020-12-18 DIAGNOSIS — R972 Elevated prostate specific antigen [PSA]: Secondary | ICD-10-CM | POA: Diagnosis not present

## 2020-12-18 DIAGNOSIS — E7800 Pure hypercholesterolemia, unspecified: Secondary | ICD-10-CM | POA: Diagnosis not present

## 2020-12-24 DIAGNOSIS — R972 Elevated prostate specific antigen [PSA]: Secondary | ICD-10-CM | POA: Diagnosis not present

## 2020-12-31 DIAGNOSIS — R3912 Poor urinary stream: Secondary | ICD-10-CM | POA: Diagnosis not present

## 2020-12-31 DIAGNOSIS — R310 Gross hematuria: Secondary | ICD-10-CM | POA: Diagnosis not present

## 2020-12-31 DIAGNOSIS — R972 Elevated prostate specific antigen [PSA]: Secondary | ICD-10-CM | POA: Diagnosis not present

## 2020-12-31 DIAGNOSIS — N401 Enlarged prostate with lower urinary tract symptoms: Secondary | ICD-10-CM | POA: Diagnosis not present

## 2021-01-02 DIAGNOSIS — D126 Benign neoplasm of colon, unspecified: Secondary | ICD-10-CM | POA: Diagnosis not present

## 2021-01-02 DIAGNOSIS — D122 Benign neoplasm of ascending colon: Secondary | ICD-10-CM | POA: Diagnosis not present

## 2021-01-02 DIAGNOSIS — D124 Benign neoplasm of descending colon: Secondary | ICD-10-CM | POA: Diagnosis not present

## 2021-01-02 DIAGNOSIS — K635 Polyp of colon: Secondary | ICD-10-CM | POA: Diagnosis not present

## 2021-01-02 DIAGNOSIS — Z9889 Other specified postprocedural states: Secondary | ICD-10-CM | POA: Diagnosis not present

## 2021-01-02 DIAGNOSIS — Z09 Encounter for follow-up examination after completed treatment for conditions other than malignant neoplasm: Secondary | ICD-10-CM | POA: Diagnosis not present

## 2021-01-02 DIAGNOSIS — Z1211 Encounter for screening for malignant neoplasm of colon: Secondary | ICD-10-CM | POA: Diagnosis not present

## 2021-01-02 DIAGNOSIS — I10 Essential (primary) hypertension: Secondary | ICD-10-CM | POA: Diagnosis not present

## 2021-01-02 DIAGNOSIS — K648 Other hemorrhoids: Secondary | ICD-10-CM | POA: Diagnosis not present

## 2021-01-02 DIAGNOSIS — Z86010 Personal history of colonic polyps: Secondary | ICD-10-CM | POA: Diagnosis not present

## 2021-01-02 DIAGNOSIS — E785 Hyperlipidemia, unspecified: Secondary | ICD-10-CM | POA: Diagnosis not present

## 2021-01-02 DIAGNOSIS — K649 Unspecified hemorrhoids: Secondary | ICD-10-CM | POA: Diagnosis not present

## 2021-01-02 LAB — HM COLONOSCOPY

## 2021-01-16 DIAGNOSIS — R972 Elevated prostate specific antigen [PSA]: Secondary | ICD-10-CM | POA: Diagnosis not present

## 2021-01-23 DIAGNOSIS — R972 Elevated prostate specific antigen [PSA]: Secondary | ICD-10-CM | POA: Diagnosis not present

## 2021-01-23 DIAGNOSIS — R310 Gross hematuria: Secondary | ICD-10-CM | POA: Diagnosis not present

## 2021-02-14 ENCOUNTER — Other Ambulatory Visit (HOSPITAL_COMMUNITY): Payer: Self-pay | Admitting: Urology

## 2021-02-14 DIAGNOSIS — R972 Elevated prostate specific antigen [PSA]: Secondary | ICD-10-CM

## 2021-02-25 ENCOUNTER — Other Ambulatory Visit: Payer: Self-pay

## 2021-02-25 ENCOUNTER — Ambulatory Visit (HOSPITAL_COMMUNITY)
Admission: RE | Admit: 2021-02-25 | Discharge: 2021-02-25 | Disposition: A | Discharge status: Home / Self Care | Payer: Medicare Other | Source: Ambulatory Visit | Attending: Urology | Admitting: Urology

## 2021-02-25 DIAGNOSIS — R590 Localized enlarged lymph nodes: Secondary | ICD-10-CM | POA: Diagnosis not present

## 2021-02-25 DIAGNOSIS — R972 Elevated prostate specific antigen [PSA]: Secondary | ICD-10-CM | POA: Diagnosis not present

## 2021-02-25 DIAGNOSIS — N400 Enlarged prostate without lower urinary tract symptoms: Secondary | ICD-10-CM | POA: Diagnosis not present

## 2021-02-25 IMAGING — MR MR PROSTATE WO/W CM
56 series · 56 of 56 positions shown · IV contrast (Contrast agent)
Comparison: None.
COMPARISON: None.

Addendum:
CLINICAL DATA: Elevated PSA level of 13.10 on [DATE]. Biopsy
[DATE] was benign.

EXAM:
MR PROSTATE WITHOUT AND WITH CONTRAST
TECHNIQUE: Multiplanar multisequence MRI images were obtained of the pelvis
centered about the prostate. Pre and post contrast images were
obtained.
CONTRAST:  9mL GADAVIST GADOBUTROL 1 MMOL/ML IV SOLN

[Series 6: T2 · coronal · 3.0mm · 0.70mm/px · 1 of 35 slices shown (1 of 3)]
[im 1/35]
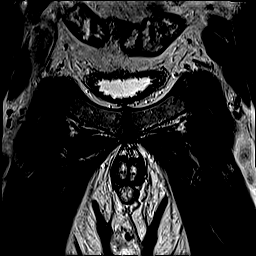

[Series 7: T2 · axial · 3.0mm · 0.56mm/px · 1 of 34 slices shown (2 of 3)]
[im 1/34]
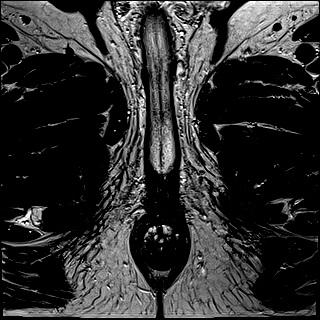

[Series 8: DWI · axial · 3.0mm · 0.86mm/px · 1 of 96 slices shown (1 of 3)]
[im 1/96]
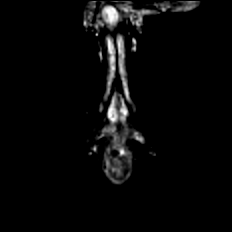

[Series 9: DWI · axial · 3.0mm · 0.86mm/px · 1 of 32 slices shown (2 of 3)]
[im 1/32]
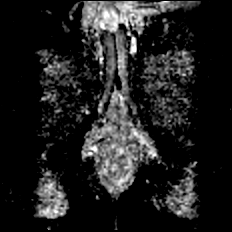

[Series 10: DWI · axial · 3.0mm · 0.86mm/px · 1 of 32 slices shown (3 of 3)]
[im 1/32]
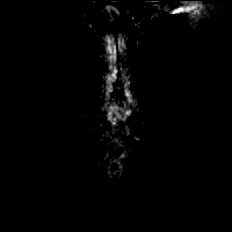

[Series 11: T2 · axial · 1.8mm · 1.04mm/px · 1 of 64 slices shown (3 of 3)]
[im 1/64]
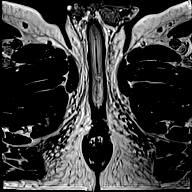

[Series 12: ax in&out whole · axial · 5.0mm · 0.74mm/px · 1 of 46 slices shown (1 of 2)]
[im 1/46]
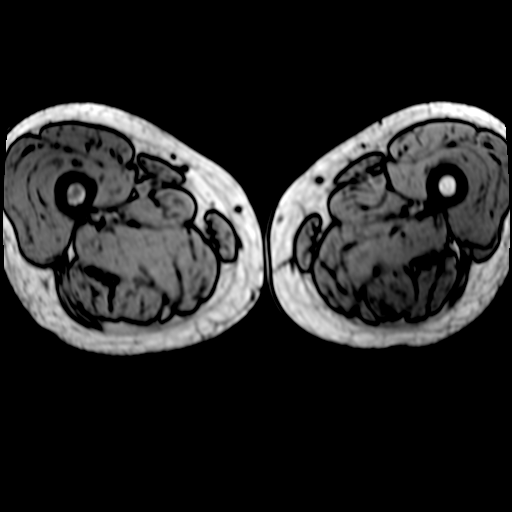

[Series 12: ax in&out whole · axial · 5.0mm · 0.74mm/px · 1 of 46 slices shown (2 of 2)]
[im 1/46]
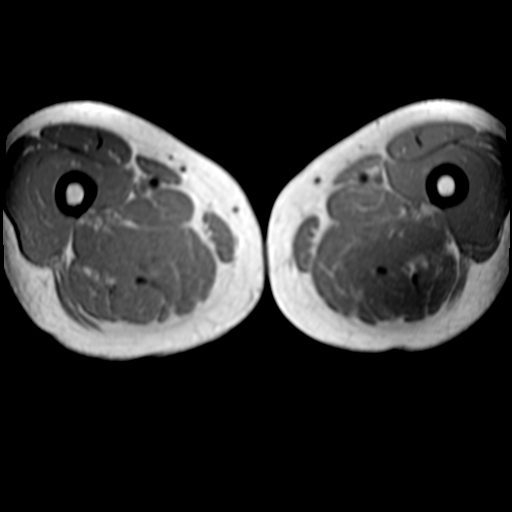

[Series 13: T1 · axial · 3.0mm · 1.15mm/px · 1 of 30 slices shown (1 of 48)]
[im 1/30]
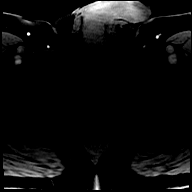

[Series 14: T1 · axial · 3.0mm · 1.15mm/px · 1 of 30 slices shown (2 of 48)]
[im 1/30]
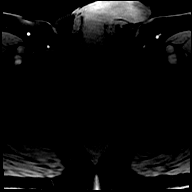

[Series 15: T1 · axial · 3.0mm · 1.15mm/px · 1 of 30 slices shown (3 of 48)]
[im 1/30]
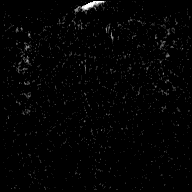

[Series 16: T1 · axial · 3.0mm · 1.15mm/px · 1 of 30 slices shown (4 of 48)]
[im 1/30]
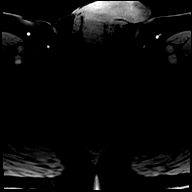

[Series 17: T1 · axial · 3.0mm · 1.15mm/px · 1 of 28 slices shown (5 of 48)]
[im 1/28]
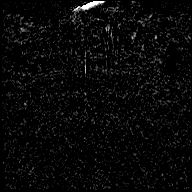

[Series 18: T1 · axial · 3.0mm · 1.15mm/px · 1 of 30 slices shown (6 of 48)]
[im 1/30]
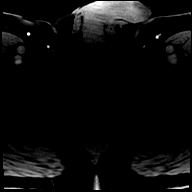

[Series 19: T1 · axial · 3.0mm · 1.15mm/px · 1 of 30 slices shown (7 of 48)]
[im 1/30]
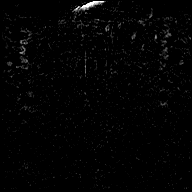

[Series 20: T1 · axial · 3.0mm · 1.15mm/px · 1 of 30 slices shown (8 of 48)]
[im 1/30]
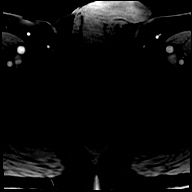

[Series 21: T1 · axial · 3.0mm · 1.15mm/px · 1 of 30 slices shown (9 of 48)]
[im 1/30]
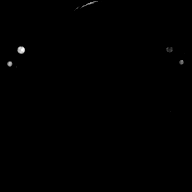

[Series 22: T1 · axial · 3.0mm · 1.15mm/px · 1 of 30 slices shown (10 of 48)]
[im 1/30]
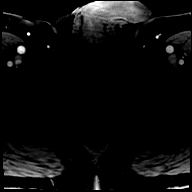

[Series 23: T1 · axial · 3.0mm · 1.15mm/px · 1 of 30 slices shown (11 of 48)]
[im 1/30]
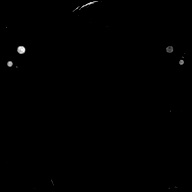

[Series 24: T1 · axial · 3.0mm · 1.15mm/px · 1 of 30 slices shown (12 of 48)]
[im 1/30]
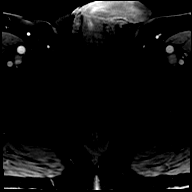

[Series 25: T1 · axial · 3.0mm · 1.15mm/px · 1 of 30 slices shown (13 of 48)]
[im 1/30]
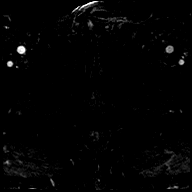

[Series 26: T1 · axial · 3.0mm · 1.15mm/px · 1 of 30 slices shown (14 of 48)]
[im 1/30]
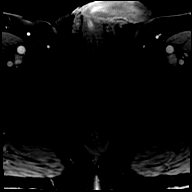

[Series 27: T1 · axial · 3.0mm · 1.15mm/px · 1 of 30 slices shown (15 of 48)]
[im 1/30]
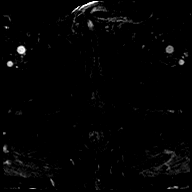

[Series 28: T1 · axial · 3.0mm · 1.15mm/px · 1 of 30 slices shown (16 of 48)]
[im 1/30]
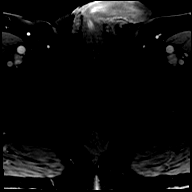

[Series 29: T1 · axial · 3.0mm · 1.15mm/px · 1 of 30 slices shown (17 of 48)]
[im 1/30]
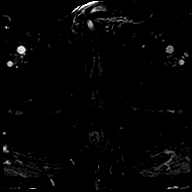

[Series 30: T1 · axial · 3.0mm · 1.15mm/px · 1 of 30 slices shown (18 of 48)]
[im 1/30]
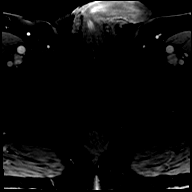

[Series 31: T1 · axial · 3.0mm · 1.15mm/px · 1 of 30 slices shown (19 of 48)]
[im 1/30]
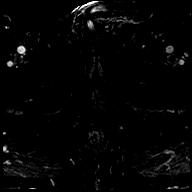

[Series 32: T1 · axial · 3.0mm · 1.15mm/px · 1 of 30 slices shown (20 of 48)]
[im 1/30]
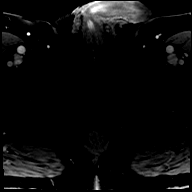

[Series 33: T1 · axial · 3.0mm · 1.15mm/px · 1 of 30 slices shown (21 of 48)]
[im 1/30]
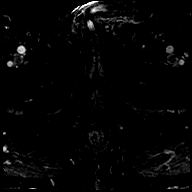

[Series 34: T1 · axial · 3.0mm · 1.15mm/px · 1 of 30 slices shown (22 of 48)]
[im 1/30]
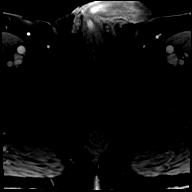

[Series 35: T1 · axial · 3.0mm · 1.15mm/px · 1 of 30 slices shown (23 of 48)]
[im 1/30]
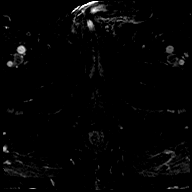

[Series 36: T1 · axial · 3.0mm · 1.15mm/px · 1 of 30 slices shown (24 of 48)]
[im 1/30]
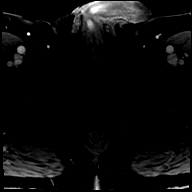

[Series 37: T1 · axial · 3.0mm · 1.15mm/px · 1 of 30 slices shown (25 of 48)]
[im 1/30]
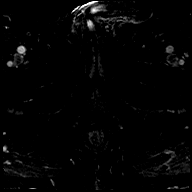

[Series 38: T1 · axial · 3.0mm · 1.15mm/px · 1 of 30 slices shown (26 of 48)]
[im 1/30]
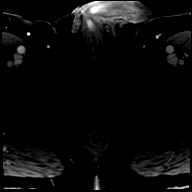

[Series 39: T1 · axial · 3.0mm · 1.15mm/px · 1 of 30 slices shown (27 of 48)]
[im 1/30]
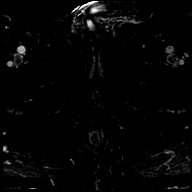

[Series 40: T1 · axial · 3.0mm · 1.15mm/px · 1 of 30 slices shown (28 of 48)]
[im 1/30]
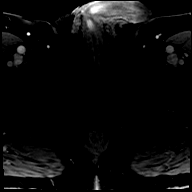

[Series 41: T1 · axial · 3.0mm · 1.15mm/px · 1 of 30 slices shown (29 of 48)]
[im 1/30]
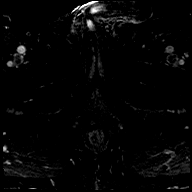

[Series 42: T1 · axial · 3.0mm · 1.15mm/px · 1 of 30 slices shown (30 of 48)]
[im 1/30]
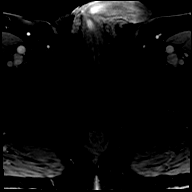

[Series 43: T1 · axial · 3.0mm · 1.15mm/px · 1 of 30 slices shown (31 of 48)]
[im 1/30]
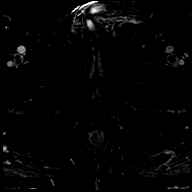

[Series 44: T1 · axial · 3.0mm · 1.15mm/px · 1 of 30 slices shown (32 of 48)]
[im 1/30]
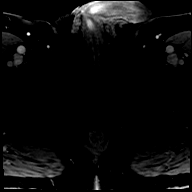

[Series 45: T1 · axial · 3.0mm · 1.15mm/px · 1 of 30 slices shown (33 of 48)]
[im 1/30]
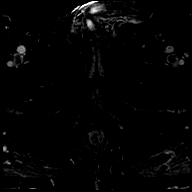

[Series 46: T1 · axial · 3.0mm · 1.15mm/px · 1 of 30 slices shown (34 of 48)]
[im 1/30]
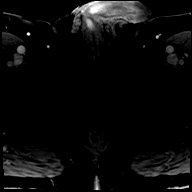

[Series 47: T1 · axial · 3.0mm · 1.15mm/px · 1 of 30 slices shown (35 of 48)]
[im 1/30]
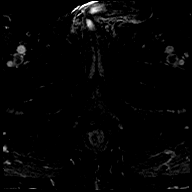

[Series 48: T1 · axial · 3.0mm · 1.15mm/px · 1 of 30 slices shown (36 of 48)]
[im 1/30]
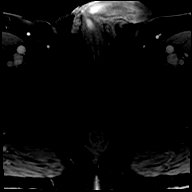

[Series 49: T1 · axial · 3.0mm · 1.15mm/px · 1 of 30 slices shown (37 of 48)]
[im 1/30]
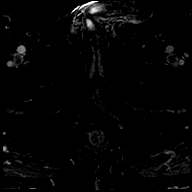

[Series 50: T1 · axial · 3.0mm · 1.15mm/px · 1 of 30 slices shown (38 of 48)]
[im 1/30]
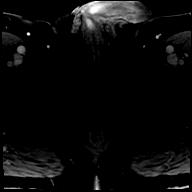

[Series 51: T1 · axial · 3.0mm · 1.15mm/px · 1 of 30 slices shown (39 of 48)]
[im 1/30]
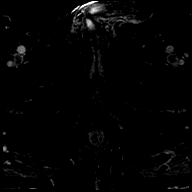

[Series 52: T1 · axial · 3.0mm · 1.15mm/px · 1 of 30 slices shown (40 of 48)]
[im 1/30]
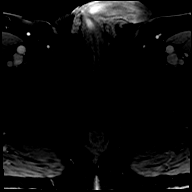

[Series 53: T1 · axial · 3.0mm · 1.15mm/px · 1 of 30 slices shown (41 of 48)]
[im 1/30]
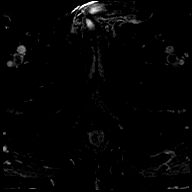

[Series 54: T1 · axial · 3.0mm · 1.15mm/px · 1 of 30 slices shown (42 of 48)]
[im 1/30]
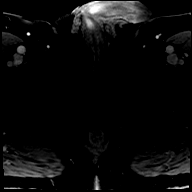

[Series 55: T1 · axial · 3.0mm · 1.15mm/px · 1 of 30 slices shown (43 of 48)]
[im 1/30]
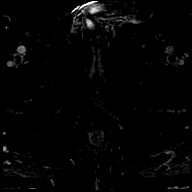

[Series 56: T1 · axial · 3.0mm · 1.15mm/px · 1 of 30 slices shown (44 of 48)]
[im 1/30]
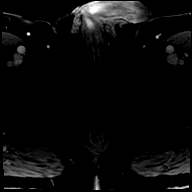

[Series 57: T1 · axial · 3.0mm · 1.15mm/px · 1 of 30 slices shown (45 of 48)]
[im 1/30]
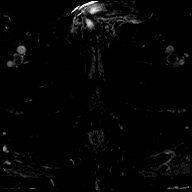

[Series 58: T1 · axial · 3.0mm · 1.15mm/px · 1 of 30 slices shown (46 of 48)]
[im 1/30]
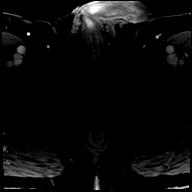

[Series 59: T1 · axial · 3.0mm · 1.15mm/px · 1 of 30 slices shown (47 of 48)]
[im 1/30]
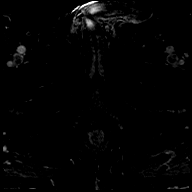

[Series 60: T1 · axial · 3.0mm · 1.15mm/px · 1 of 30 slices shown (48 of 48)]
[im 1/30]
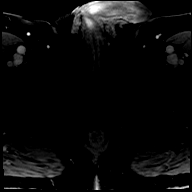

[56 of 56 positions shown; findings below may reference images not displayed]

FINDINGS: Prostate:

Region of interest # 1: PI-RADS category 4 midline lesion of the
central zone with focally reduced T2 signal (image 29, series 11),
focally accentuated diffusion (image 16, series 10), and focal early
enhancement (image 15, series 23). This measures 0.45 cc (0.9 by
by 0.9 cm).

Region of interest # 2: Small PI-RADS category 4 lesion of the left
posteromedial peripheral zone at the apex, with focally reduced T2
signal (image 42, series 11) and mild focal early enhancement (image
23, series 25). This measures 0.32 cc (1.0 by 0.6 by 0.7 cm).

Hazy low T2 signal in the peripheral zone bilaterally is likely
postinflammatory and considered PI-RADS category 2.

Encapsulated nodularity in the transition zone compatible with
benign prostatic hypertrophy.

Volume: 3D volumetric analysis: Prostate volume 52.29 cc (5.4 by
by 5.2 cm).

Transcapsular spread:  Absent

Seminal vesicle involvement: Absent

Neurovascular bundle involvement: Absent

Pelvic adenopathy: Absent

Bone metastasis: Absent

Other findings: Suspected small right scrotal hydrocele.
IMPRESSION: 1. PI-RADS category 4 lesion of the midline central zone. Small
PI-RADS category 4 lesion of the left posterolateral peripheral zone
at the apex. Targeting data sent to [facility].
2. Mild prostatomegaly.  Benign prostatic hypertrophy.
3. Suspected small right scrotal hydrocele.

ADDENDUM:
The original report was by Dr. SHEILAMAE. The following
addendum is by Dr. SHEILAMAE:

I discussed this case with Dr. SHEILAMAE at [DATE] p.m. on [DATE].

There was some concern as the region of interest # 1 lesion is
extending extra prostatic based on the original prostate gland
boundary, with the drawn boundary the prostate gland potentially
restricting capability for biopsy of this lesion using the [facility]
system. Accordingly, I have reprocessed the case, redrawn the
prostate gland to fully include region of interest # 1, and updated
drawing of regions of interest.

As a result , the re-drawn region of interest # 1 measures 0.40 cc
(0.8 by 0.7 by 1.1 cm). The re-drawn region of interest # 2 measures
0.33 cc (1.0 by 0.6 by 0.7 cm). The reader on prostate volume is
56.1 cc (5.4 by 4.6 by 5.3 cm). These new boundaries have been
re-exported.

*** End of Addendum ***
FINDINGS: Prostate:

Region of interest # 1: PI-RADS category 4 midline lesion of the
central zone with focally reduced T2 signal (image 29, series 11),
focally accentuated diffusion (image 16, series 10), and focal early
enhancement (image 15, series 23). This measures 0.45 cc (0.9 by
by 0.9 cm).

Region of interest # 2: Small PI-RADS category 4 lesion of the left
posteromedial peripheral zone at the apex, with focally reduced T2
signal (image 42, series 11) and mild focal early enhancement (image
23, series 25). This measures 0.32 cc (1.0 by 0.6 by 0.7 cm).

Hazy low T2 signal in the peripheral zone bilaterally is likely
postinflammatory and considered PI-RADS category 2.

Encapsulated nodularity in the transition zone compatible with
benign prostatic hypertrophy.

Volume: 3D volumetric analysis: Prostate volume 52.29 cc (5.4 by
by 5.2 cm).

Transcapsular spread:  Absent

Seminal vesicle involvement: Absent

Neurovascular bundle involvement: Absent

Pelvic adenopathy: Absent

Bone metastasis: Absent

Other findings: Suspected small right scrotal hydrocele.
IMPRESSION: 1. PI-RADS category 4 lesion of the midline central zone. Small
PI-RADS category 4 lesion of the left posterolateral peripheral zone
at the apex. Targeting data sent to [facility].
2. Mild prostatomegaly.  Benign prostatic hypertrophy.
3. Suspected small right scrotal hydrocele.

## 2021-02-25 MED ORDER — GADOBUTROL 1 MMOL/ML IV SOLN
9.0000 mL | Freq: Once | INTRAVENOUS | Status: AC | PRN
  Administered 2021-02-25: 9 mL via INTRAVENOUS

## 2021-03-04 DIAGNOSIS — L814 Other melanin hyperpigmentation: Secondary | ICD-10-CM | POA: Diagnosis not present

## 2021-03-04 DIAGNOSIS — Z85828 Personal history of other malignant neoplasm of skin: Secondary | ICD-10-CM | POA: Diagnosis not present

## 2021-03-04 DIAGNOSIS — L570 Actinic keratosis: Secondary | ICD-10-CM | POA: Diagnosis not present

## 2021-03-04 DIAGNOSIS — L821 Other seborrheic keratosis: Secondary | ICD-10-CM | POA: Diagnosis not present

## 2021-03-04 DIAGNOSIS — L578 Other skin changes due to chronic exposure to nonionizing radiation: Secondary | ICD-10-CM | POA: Diagnosis not present

## 2021-03-04 DIAGNOSIS — Z23 Encounter for immunization: Secondary | ICD-10-CM | POA: Diagnosis not present

## 2021-03-08 DIAGNOSIS — R3912 Poor urinary stream: Secondary | ICD-10-CM | POA: Diagnosis not present

## 2021-03-08 DIAGNOSIS — R3121 Asymptomatic microscopic hematuria: Secondary | ICD-10-CM | POA: Diagnosis not present

## 2021-03-08 DIAGNOSIS — R972 Elevated prostate specific antigen [PSA]: Secondary | ICD-10-CM | POA: Diagnosis not present

## 2021-03-08 DIAGNOSIS — N401 Enlarged prostate with lower urinary tract symptoms: Secondary | ICD-10-CM | POA: Diagnosis not present

## 2021-03-19 DIAGNOSIS — J019 Acute sinusitis, unspecified: Secondary | ICD-10-CM | POA: Diagnosis not present

## 2021-04-03 DIAGNOSIS — D075 Carcinoma in situ of prostate: Secondary | ICD-10-CM | POA: Diagnosis not present

## 2021-04-03 DIAGNOSIS — R972 Elevated prostate specific antigen [PSA]: Secondary | ICD-10-CM | POA: Diagnosis not present

## 2021-06-19 DIAGNOSIS — Z1389 Encounter for screening for other disorder: Secondary | ICD-10-CM | POA: Diagnosis not present

## 2021-06-19 DIAGNOSIS — M1990 Unspecified osteoarthritis, unspecified site: Secondary | ICD-10-CM | POA: Diagnosis not present

## 2021-06-19 DIAGNOSIS — I10 Essential (primary) hypertension: Secondary | ICD-10-CM | POA: Diagnosis not present

## 2021-06-19 DIAGNOSIS — E7800 Pure hypercholesterolemia, unspecified: Secondary | ICD-10-CM | POA: Diagnosis not present

## 2021-06-19 DIAGNOSIS — R972 Elevated prostate specific antigen [PSA]: Secondary | ICD-10-CM | POA: Diagnosis not present

## 2021-06-19 DIAGNOSIS — Z0000 Encounter for general adult medical examination without abnormal findings: Secondary | ICD-10-CM | POA: Diagnosis not present

## 2021-08-29 DIAGNOSIS — H35033 Hypertensive retinopathy, bilateral: Secondary | ICD-10-CM | POA: Diagnosis not present

## 2021-08-29 DIAGNOSIS — H25013 Cortical age-related cataract, bilateral: Secondary | ICD-10-CM | POA: Diagnosis not present

## 2021-08-29 DIAGNOSIS — H35363 Drusen (degenerative) of macula, bilateral: Secondary | ICD-10-CM | POA: Diagnosis not present

## 2021-08-29 DIAGNOSIS — H2513 Age-related nuclear cataract, bilateral: Secondary | ICD-10-CM | POA: Diagnosis not present

## 2021-08-29 DIAGNOSIS — H25041 Posterior subcapsular polar age-related cataract, right eye: Secondary | ICD-10-CM | POA: Diagnosis not present

## 2021-09-02 DIAGNOSIS — L814 Other melanin hyperpigmentation: Secondary | ICD-10-CM | POA: Diagnosis not present

## 2021-09-02 DIAGNOSIS — L570 Actinic keratosis: Secondary | ICD-10-CM | POA: Diagnosis not present

## 2021-09-02 DIAGNOSIS — L578 Other skin changes due to chronic exposure to nonionizing radiation: Secondary | ICD-10-CM | POA: Diagnosis not present

## 2021-09-02 DIAGNOSIS — L821 Other seborrheic keratosis: Secondary | ICD-10-CM | POA: Diagnosis not present

## 2021-09-02 DIAGNOSIS — Z85828 Personal history of other malignant neoplasm of skin: Secondary | ICD-10-CM | POA: Diagnosis not present

## 2021-09-02 DIAGNOSIS — D229 Melanocytic nevi, unspecified: Secondary | ICD-10-CM | POA: Diagnosis not present

## 2021-09-23 DIAGNOSIS — R972 Elevated prostate specific antigen [PSA]: Secondary | ICD-10-CM | POA: Diagnosis not present

## 2021-09-30 DIAGNOSIS — R972 Elevated prostate specific antigen [PSA]: Secondary | ICD-10-CM | POA: Diagnosis not present

## 2021-09-30 DIAGNOSIS — R3912 Poor urinary stream: Secondary | ICD-10-CM | POA: Diagnosis not present

## 2021-09-30 DIAGNOSIS — N401 Enlarged prostate with lower urinary tract symptoms: Secondary | ICD-10-CM | POA: Diagnosis not present

## 2021-10-15 DIAGNOSIS — Z23 Encounter for immunization: Secondary | ICD-10-CM | POA: Diagnosis not present

## 2021-12-18 DIAGNOSIS — I10 Essential (primary) hypertension: Secondary | ICD-10-CM | POA: Diagnosis not present

## 2021-12-18 DIAGNOSIS — E7800 Pure hypercholesterolemia, unspecified: Secondary | ICD-10-CM | POA: Diagnosis not present

## 2021-12-18 DIAGNOSIS — M1990 Unspecified osteoarthritis, unspecified site: Secondary | ICD-10-CM | POA: Diagnosis not present

## 2021-12-18 DIAGNOSIS — M722 Plantar fascial fibromatosis: Secondary | ICD-10-CM | POA: Diagnosis not present

## 2021-12-18 DIAGNOSIS — R972 Elevated prostate specific antigen [PSA]: Secondary | ICD-10-CM | POA: Diagnosis not present

## 2022-02-04 DIAGNOSIS — H903 Sensorineural hearing loss, bilateral: Secondary | ICD-10-CM | POA: Diagnosis not present

## 2022-03-06 DIAGNOSIS — Z85828 Personal history of other malignant neoplasm of skin: Secondary | ICD-10-CM | POA: Diagnosis not present

## 2022-03-06 DIAGNOSIS — D229 Melanocytic nevi, unspecified: Secondary | ICD-10-CM | POA: Diagnosis not present

## 2022-03-06 DIAGNOSIS — L821 Other seborrheic keratosis: Secondary | ICD-10-CM | POA: Diagnosis not present

## 2022-03-06 DIAGNOSIS — L570 Actinic keratosis: Secondary | ICD-10-CM | POA: Diagnosis not present

## 2022-03-06 DIAGNOSIS — L219 Seborrheic dermatitis, unspecified: Secondary | ICD-10-CM | POA: Diagnosis not present

## 2022-03-06 DIAGNOSIS — L578 Other skin changes due to chronic exposure to nonionizing radiation: Secondary | ICD-10-CM | POA: Diagnosis not present

## 2022-03-06 DIAGNOSIS — L814 Other melanin hyperpigmentation: Secondary | ICD-10-CM | POA: Diagnosis not present

## 2022-03-21 DIAGNOSIS — R972 Elevated prostate specific antigen [PSA]: Secondary | ICD-10-CM | POA: Diagnosis not present

## 2022-03-28 DIAGNOSIS — N400 Enlarged prostate without lower urinary tract symptoms: Secondary | ICD-10-CM | POA: Diagnosis not present

## 2022-03-28 DIAGNOSIS — R310 Gross hematuria: Secondary | ICD-10-CM | POA: Diagnosis not present

## 2022-03-28 DIAGNOSIS — R3912 Poor urinary stream: Secondary | ICD-10-CM | POA: Diagnosis not present

## 2022-03-28 DIAGNOSIS — R972 Elevated prostate specific antigen [PSA]: Secondary | ICD-10-CM | POA: Diagnosis not present

## 2022-06-24 ENCOUNTER — Other Ambulatory Visit (HOSPITAL_COMMUNITY): Payer: Self-pay | Admitting: Internal Medicine

## 2022-06-24 DIAGNOSIS — E7800 Pure hypercholesterolemia, unspecified: Secondary | ICD-10-CM | POA: Diagnosis not present

## 2022-06-24 DIAGNOSIS — Z0000 Encounter for general adult medical examination without abnormal findings: Secondary | ICD-10-CM | POA: Diagnosis not present

## 2022-06-24 DIAGNOSIS — I10 Essential (primary) hypertension: Secondary | ICD-10-CM | POA: Diagnosis not present

## 2022-06-24 DIAGNOSIS — Z1389 Encounter for screening for other disorder: Secondary | ICD-10-CM | POA: Diagnosis not present

## 2022-06-24 DIAGNOSIS — R972 Elevated prostate specific antigen [PSA]: Secondary | ICD-10-CM | POA: Diagnosis not present

## 2022-06-24 DIAGNOSIS — Z8249 Family history of ischemic heart disease and other diseases of the circulatory system: Secondary | ICD-10-CM | POA: Diagnosis not present

## 2022-07-15 ENCOUNTER — Ambulatory Visit (HOSPITAL_COMMUNITY)
Admission: RE | Admit: 2022-07-15 | Discharge: 2022-07-15 | Disposition: A | Discharge status: Home / Self Care | Payer: Self-pay | Source: Ambulatory Visit | Attending: Internal Medicine | Admitting: Internal Medicine

## 2022-07-15 DIAGNOSIS — E7800 Pure hypercholesterolemia, unspecified: Secondary | ICD-10-CM | POA: Insufficient documentation

## 2022-09-01 DIAGNOSIS — H04123 Dry eye syndrome of bilateral lacrimal glands: Secondary | ICD-10-CM | POA: Diagnosis not present

## 2022-09-01 DIAGNOSIS — H25813 Combined forms of age-related cataract, bilateral: Secondary | ICD-10-CM | POA: Diagnosis not present

## 2022-09-01 DIAGNOSIS — H43393 Other vitreous opacities, bilateral: Secondary | ICD-10-CM | POA: Diagnosis not present

## 2022-09-01 DIAGNOSIS — H35363 Drusen (degenerative) of macula, bilateral: Secondary | ICD-10-CM | POA: Diagnosis not present

## 2022-09-02 DIAGNOSIS — L814 Other melanin hyperpigmentation: Secondary | ICD-10-CM | POA: Diagnosis not present

## 2022-09-02 DIAGNOSIS — L578 Other skin changes due to chronic exposure to nonionizing radiation: Secondary | ICD-10-CM | POA: Diagnosis not present

## 2022-09-02 DIAGNOSIS — D229 Melanocytic nevi, unspecified: Secondary | ICD-10-CM | POA: Diagnosis not present

## 2022-09-02 DIAGNOSIS — Z85828 Personal history of other malignant neoplasm of skin: Secondary | ICD-10-CM | POA: Diagnosis not present

## 2022-09-02 DIAGNOSIS — L570 Actinic keratosis: Secondary | ICD-10-CM | POA: Diagnosis not present

## 2022-09-02 DIAGNOSIS — C4442 Squamous cell carcinoma of skin of scalp and neck: Secondary | ICD-10-CM | POA: Diagnosis not present

## 2022-09-02 DIAGNOSIS — D485 Neoplasm of uncertain behavior of skin: Secondary | ICD-10-CM | POA: Diagnosis not present

## 2022-09-02 DIAGNOSIS — L821 Other seborrheic keratosis: Secondary | ICD-10-CM | POA: Diagnosis not present

## 2022-09-17 DIAGNOSIS — R972 Elevated prostate specific antigen [PSA]: Secondary | ICD-10-CM | POA: Diagnosis not present

## 2022-09-26 DIAGNOSIS — N400 Enlarged prostate without lower urinary tract symptoms: Secondary | ICD-10-CM | POA: Diagnosis not present

## 2022-10-02 DIAGNOSIS — C4442 Squamous cell carcinoma of skin of scalp and neck: Secondary | ICD-10-CM | POA: Diagnosis not present

## 2022-10-02 DIAGNOSIS — L905 Scar conditions and fibrosis of skin: Secondary | ICD-10-CM | POA: Diagnosis not present

## 2022-11-24 DIAGNOSIS — L570 Actinic keratosis: Secondary | ICD-10-CM | POA: Diagnosis not present

## 2022-12-24 DIAGNOSIS — Z23 Encounter for immunization: Secondary | ICD-10-CM | POA: Diagnosis not present

## 2022-12-24 DIAGNOSIS — R972 Elevated prostate specific antigen [PSA]: Secondary | ICD-10-CM | POA: Diagnosis not present

## 2022-12-24 DIAGNOSIS — I700 Atherosclerosis of aorta: Secondary | ICD-10-CM | POA: Diagnosis not present

## 2022-12-24 DIAGNOSIS — E7800 Pure hypercholesterolemia, unspecified: Secondary | ICD-10-CM | POA: Diagnosis not present

## 2022-12-24 DIAGNOSIS — I10 Essential (primary) hypertension: Secondary | ICD-10-CM | POA: Diagnosis not present

## 2023-01-21 DIAGNOSIS — L814 Other melanin hyperpigmentation: Secondary | ICD-10-CM | POA: Diagnosis not present

## 2023-01-21 DIAGNOSIS — L570 Actinic keratosis: Secondary | ICD-10-CM | POA: Diagnosis not present

## 2023-01-21 DIAGNOSIS — Z9289 Personal history of other medical treatment: Secondary | ICD-10-CM | POA: Diagnosis not present

## 2023-03-09 DIAGNOSIS — L570 Actinic keratosis: Secondary | ICD-10-CM | POA: Diagnosis not present

## 2023-03-09 DIAGNOSIS — L821 Other seborrheic keratosis: Secondary | ICD-10-CM | POA: Diagnosis not present

## 2023-03-09 DIAGNOSIS — L814 Other melanin hyperpigmentation: Secondary | ICD-10-CM | POA: Diagnosis not present

## 2023-03-09 DIAGNOSIS — Z85828 Personal history of other malignant neoplasm of skin: Secondary | ICD-10-CM | POA: Diagnosis not present

## 2023-03-09 DIAGNOSIS — D485 Neoplasm of uncertain behavior of skin: Secondary | ICD-10-CM | POA: Diagnosis not present

## 2023-03-09 DIAGNOSIS — D229 Melanocytic nevi, unspecified: Secondary | ICD-10-CM | POA: Diagnosis not present

## 2023-03-09 DIAGNOSIS — L578 Other skin changes due to chronic exposure to nonionizing radiation: Secondary | ICD-10-CM | POA: Diagnosis not present

## 2023-04-01 DIAGNOSIS — N401 Enlarged prostate with lower urinary tract symptoms: Secondary | ICD-10-CM | POA: Diagnosis not present

## 2023-04-01 DIAGNOSIS — R972 Elevated prostate specific antigen [PSA]: Secondary | ICD-10-CM | POA: Diagnosis not present

## 2023-04-01 DIAGNOSIS — N4231 Prostatic intraepithelial neoplasia: Secondary | ICD-10-CM | POA: Diagnosis not present

## 2023-05-07 DIAGNOSIS — I700 Atherosclerosis of aorta: Secondary | ICD-10-CM | POA: Diagnosis not present

## 2023-05-07 DIAGNOSIS — I10 Essential (primary) hypertension: Secondary | ICD-10-CM | POA: Diagnosis not present

## 2023-05-13 DIAGNOSIS — E7800 Pure hypercholesterolemia, unspecified: Secondary | ICD-10-CM | POA: Diagnosis not present

## 2023-05-13 DIAGNOSIS — I700 Atherosclerosis of aorta: Secondary | ICD-10-CM | POA: Diagnosis not present

## 2023-05-13 DIAGNOSIS — I10 Essential (primary) hypertension: Secondary | ICD-10-CM | POA: Diagnosis not present

## 2023-06-05 DIAGNOSIS — I700 Atherosclerosis of aorta: Secondary | ICD-10-CM | POA: Diagnosis not present

## 2023-06-05 DIAGNOSIS — I10 Essential (primary) hypertension: Secondary | ICD-10-CM | POA: Diagnosis not present

## 2023-06-13 DIAGNOSIS — I700 Atherosclerosis of aorta: Secondary | ICD-10-CM | POA: Diagnosis not present

## 2023-06-13 DIAGNOSIS — E7800 Pure hypercholesterolemia, unspecified: Secondary | ICD-10-CM | POA: Diagnosis not present

## 2023-06-13 DIAGNOSIS — I10 Essential (primary) hypertension: Secondary | ICD-10-CM | POA: Diagnosis not present

## 2023-06-26 DIAGNOSIS — I700 Atherosclerosis of aorta: Secondary | ICD-10-CM | POA: Diagnosis not present

## 2023-06-26 DIAGNOSIS — Z1389 Encounter for screening for other disorder: Secondary | ICD-10-CM | POA: Diagnosis not present

## 2023-06-26 DIAGNOSIS — I10 Essential (primary) hypertension: Secondary | ICD-10-CM | POA: Diagnosis not present

## 2023-06-26 DIAGNOSIS — E7800 Pure hypercholesterolemia, unspecified: Secondary | ICD-10-CM | POA: Diagnosis not present

## 2023-06-26 DIAGNOSIS — Z23 Encounter for immunization: Secondary | ICD-10-CM | POA: Diagnosis not present

## 2023-06-26 DIAGNOSIS — Z0000 Encounter for general adult medical examination without abnormal findings: Secondary | ICD-10-CM | POA: Diagnosis not present

## 2023-06-26 DIAGNOSIS — K635 Polyp of colon: Secondary | ICD-10-CM | POA: Diagnosis not present

## 2023-06-26 DIAGNOSIS — M5090 Cervical disc disorder, unspecified, unspecified cervical region: Secondary | ICD-10-CM | POA: Diagnosis not present

## 2023-06-26 DIAGNOSIS — R972 Elevated prostate specific antigen [PSA]: Secondary | ICD-10-CM | POA: Diagnosis not present

## 2023-07-05 DIAGNOSIS — I10 Essential (primary) hypertension: Secondary | ICD-10-CM | POA: Diagnosis not present

## 2023-07-05 DIAGNOSIS — I700 Atherosclerosis of aorta: Secondary | ICD-10-CM | POA: Diagnosis not present

## 2023-07-13 DIAGNOSIS — I700 Atherosclerosis of aorta: Secondary | ICD-10-CM | POA: Diagnosis not present

## 2023-07-13 DIAGNOSIS — I10 Essential (primary) hypertension: Secondary | ICD-10-CM | POA: Diagnosis not present

## 2023-07-13 DIAGNOSIS — E7800 Pure hypercholesterolemia, unspecified: Secondary | ICD-10-CM | POA: Diagnosis not present

## 2023-08-04 DIAGNOSIS — I700 Atherosclerosis of aorta: Secondary | ICD-10-CM | POA: Diagnosis not present

## 2023-08-04 DIAGNOSIS — I10 Essential (primary) hypertension: Secondary | ICD-10-CM | POA: Diagnosis not present

## 2023-08-13 DIAGNOSIS — E7800 Pure hypercholesterolemia, unspecified: Secondary | ICD-10-CM | POA: Diagnosis not present

## 2023-08-13 DIAGNOSIS — I10 Essential (primary) hypertension: Secondary | ICD-10-CM | POA: Diagnosis not present

## 2023-08-13 DIAGNOSIS — I700 Atherosclerosis of aorta: Secondary | ICD-10-CM | POA: Diagnosis not present

## 2023-09-01 DIAGNOSIS — H35363 Drusen (degenerative) of macula, bilateral: Secondary | ICD-10-CM | POA: Diagnosis not present

## 2023-09-01 DIAGNOSIS — H04123 Dry eye syndrome of bilateral lacrimal glands: Secondary | ICD-10-CM | POA: Diagnosis not present

## 2023-09-01 DIAGNOSIS — H25813 Combined forms of age-related cataract, bilateral: Secondary | ICD-10-CM | POA: Diagnosis not present

## 2023-09-01 DIAGNOSIS — H43393 Other vitreous opacities, bilateral: Secondary | ICD-10-CM | POA: Diagnosis not present

## 2023-09-03 DIAGNOSIS — I10 Essential (primary) hypertension: Secondary | ICD-10-CM | POA: Diagnosis not present

## 2023-09-03 DIAGNOSIS — I700 Atherosclerosis of aorta: Secondary | ICD-10-CM | POA: Diagnosis not present

## 2023-09-13 DIAGNOSIS — I700 Atherosclerosis of aorta: Secondary | ICD-10-CM | POA: Diagnosis not present

## 2023-09-13 DIAGNOSIS — E7800 Pure hypercholesterolemia, unspecified: Secondary | ICD-10-CM | POA: Diagnosis not present

## 2023-09-13 DIAGNOSIS — I10 Essential (primary) hypertension: Secondary | ICD-10-CM | POA: Diagnosis not present

## 2023-09-28 DIAGNOSIS — N4231 Prostatic intraepithelial neoplasia: Secondary | ICD-10-CM | POA: Diagnosis not present

## 2023-09-28 DIAGNOSIS — R972 Elevated prostate specific antigen [PSA]: Secondary | ICD-10-CM | POA: Diagnosis not present

## 2023-09-30 DIAGNOSIS — Z85828 Personal history of other malignant neoplasm of skin: Secondary | ICD-10-CM | POA: Diagnosis not present

## 2023-09-30 DIAGNOSIS — L570 Actinic keratosis: Secondary | ICD-10-CM | POA: Diagnosis not present

## 2023-09-30 DIAGNOSIS — L814 Other melanin hyperpigmentation: Secondary | ICD-10-CM | POA: Diagnosis not present

## 2023-09-30 DIAGNOSIS — L821 Other seborrheic keratosis: Secondary | ICD-10-CM | POA: Diagnosis not present

## 2023-09-30 DIAGNOSIS — L578 Other skin changes due to chronic exposure to nonionizing radiation: Secondary | ICD-10-CM | POA: Diagnosis not present

## 2023-09-30 DIAGNOSIS — D229 Melanocytic nevi, unspecified: Secondary | ICD-10-CM | POA: Diagnosis not present

## 2023-10-03 DIAGNOSIS — I10 Essential (primary) hypertension: Secondary | ICD-10-CM | POA: Diagnosis not present

## 2023-10-03 DIAGNOSIS — I700 Atherosclerosis of aorta: Secondary | ICD-10-CM | POA: Diagnosis not present

## 2023-10-05 DIAGNOSIS — N401 Enlarged prostate with lower urinary tract symptoms: Secondary | ICD-10-CM | POA: Diagnosis not present

## 2023-10-05 DIAGNOSIS — R972 Elevated prostate specific antigen [PSA]: Secondary | ICD-10-CM | POA: Diagnosis not present

## 2023-10-12 DIAGNOSIS — R972 Elevated prostate specific antigen [PSA]: Secondary | ICD-10-CM | POA: Diagnosis not present

## 2023-10-13 DIAGNOSIS — I10 Essential (primary) hypertension: Secondary | ICD-10-CM | POA: Diagnosis not present

## 2023-10-13 DIAGNOSIS — E7800 Pure hypercholesterolemia, unspecified: Secondary | ICD-10-CM | POA: Diagnosis not present

## 2023-10-13 DIAGNOSIS — I700 Atherosclerosis of aorta: Secondary | ICD-10-CM | POA: Diagnosis not present

## 2023-10-30 DIAGNOSIS — N4232 Atypical small acinar proliferation of prostate: Secondary | ICD-10-CM | POA: Diagnosis not present

## 2023-10-30 DIAGNOSIS — R972 Elevated prostate specific antigen [PSA]: Secondary | ICD-10-CM | POA: Diagnosis not present

## 2023-10-30 DIAGNOSIS — N4231 Prostatic intraepithelial neoplasia: Secondary | ICD-10-CM | POA: Diagnosis not present

## 2023-10-30 DIAGNOSIS — C61 Malignant neoplasm of prostate: Secondary | ICD-10-CM | POA: Diagnosis not present

## 2023-11-02 DIAGNOSIS — I10 Essential (primary) hypertension: Secondary | ICD-10-CM | POA: Diagnosis not present

## 2023-11-02 DIAGNOSIS — I700 Atherosclerosis of aorta: Secondary | ICD-10-CM | POA: Diagnosis not present

## 2023-11-09 DIAGNOSIS — R310 Gross hematuria: Secondary | ICD-10-CM | POA: Diagnosis not present

## 2023-11-09 DIAGNOSIS — N401 Enlarged prostate with lower urinary tract symptoms: Secondary | ICD-10-CM | POA: Diagnosis not present

## 2023-11-09 DIAGNOSIS — C61 Malignant neoplasm of prostate: Secondary | ICD-10-CM | POA: Diagnosis not present

## 2023-11-12 ENCOUNTER — Other Ambulatory Visit (HOSPITAL_COMMUNITY): Payer: Self-pay | Admitting: Urology

## 2023-11-12 DIAGNOSIS — C61 Malignant neoplasm of prostate: Secondary | ICD-10-CM

## 2023-11-13 DIAGNOSIS — I700 Atherosclerosis of aorta: Secondary | ICD-10-CM | POA: Diagnosis not present

## 2023-11-13 DIAGNOSIS — E7800 Pure hypercholesterolemia, unspecified: Secondary | ICD-10-CM | POA: Diagnosis not present

## 2023-11-13 DIAGNOSIS — I10 Essential (primary) hypertension: Secondary | ICD-10-CM | POA: Diagnosis not present

## 2023-11-17 ENCOUNTER — Ambulatory Visit (HOSPITAL_COMMUNITY)
Admission: RE | Admit: 2023-11-17 | Discharge: 2023-11-17 | Disposition: A | Discharge status: Home / Self Care | Source: Ambulatory Visit | Attending: Urology | Admitting: Urology

## 2023-11-17 ENCOUNTER — Encounter (HOSPITAL_COMMUNITY)
Admission: RE | Admit: 2023-11-17 | Discharge: 2023-11-17 | Disposition: A | Discharge status: Home / Self Care | Source: Ambulatory Visit | Attending: Urology | Admitting: Urology

## 2023-11-17 DIAGNOSIS — C61 Malignant neoplasm of prostate: Secondary | ICD-10-CM | POA: Diagnosis not present

## 2023-11-17 MED ORDER — TECHNETIUM TC 99M MEDRONATE IV KIT
20.0000 | PACK | Freq: Once | INTRAVENOUS | Status: AC | PRN
  Administered 2023-11-17: 22 via INTRAVENOUS

## 2023-11-21 DIAGNOSIS — Z23 Encounter for immunization: Secondary | ICD-10-CM | POA: Diagnosis not present

## 2023-11-25 DIAGNOSIS — R310 Gross hematuria: Secondary | ICD-10-CM | POA: Diagnosis not present

## 2023-11-25 DIAGNOSIS — N400 Enlarged prostate without lower urinary tract symptoms: Secondary | ICD-10-CM | POA: Diagnosis not present

## 2023-12-02 DIAGNOSIS — I10 Essential (primary) hypertension: Secondary | ICD-10-CM | POA: Diagnosis not present

## 2023-12-02 DIAGNOSIS — I700 Atherosclerosis of aorta: Secondary | ICD-10-CM | POA: Diagnosis not present

## 2023-12-13 DIAGNOSIS — E7800 Pure hypercholesterolemia, unspecified: Secondary | ICD-10-CM | POA: Diagnosis not present

## 2023-12-13 DIAGNOSIS — I10 Essential (primary) hypertension: Secondary | ICD-10-CM | POA: Diagnosis not present

## 2023-12-13 DIAGNOSIS — I700 Atherosclerosis of aorta: Secondary | ICD-10-CM | POA: Diagnosis not present

## 2023-12-16 ENCOUNTER — Telehealth: Payer: Self-pay | Admitting: Gastroenterology

## 2023-12-16 NOTE — Telephone Encounter (Signed)
 Good afternoon Dr. Legrand,   Patient called stating that he had his previous GI records sent over for you to review. He states he is requesting a transfer of care over to you because his wife is a patient of yours and they both really like you and how you cared for his wife. Patient also has a referral in from his PCP Dr. Ransom to have a colonoscopy. Patient last procedure was done with [facility] in 2021. Patients records are in epic under the media tab. He also wanted me to mention that he wanted you to review his CT scan that is in epic under the imaging tab from Dr. Shane as well.  Will you please review and advise on scheduling patient for procedure?  Thank you.

## 2023-12-16 NOTE — Telephone Encounter (Signed)
 Yes I will see him.  Since there are records and imaging to review, please make him an office appointment to see me.  We will review things and discuss at that visit.  - H. Danis

## 2023-12-22 ENCOUNTER — Encounter: Payer: Self-pay | Admitting: Gastroenterology

## 2023-12-22 NOTE — Telephone Encounter (Signed)
 Patient has been scheduled for an office visit with Dr. Legrand on 02/02/2024 at 9:00

## 2023-12-28 DIAGNOSIS — I700 Atherosclerosis of aorta: Secondary | ICD-10-CM | POA: Diagnosis not present

## 2023-12-28 DIAGNOSIS — I10 Essential (primary) hypertension: Secondary | ICD-10-CM | POA: Diagnosis not present

## 2023-12-28 DIAGNOSIS — E7800 Pure hypercholesterolemia, unspecified: Secondary | ICD-10-CM | POA: Diagnosis not present

## 2023-12-28 DIAGNOSIS — C61 Malignant neoplasm of prostate: Secondary | ICD-10-CM | POA: Diagnosis not present

## 2023-12-28 DIAGNOSIS — N400 Enlarged prostate without lower urinary tract symptoms: Secondary | ICD-10-CM | POA: Diagnosis not present

## 2024-02-02 ENCOUNTER — Ambulatory Visit: Admitting: Gastroenterology

## 2024-02-02 ENCOUNTER — Encounter: Payer: Self-pay | Admitting: Gastroenterology

## 2024-02-02 VITALS — BP 124/70 | HR 68 | Ht 72.0 in | Wt 228.2 lb

## 2024-02-02 DIAGNOSIS — Z860100 Personal history of colon polyps, unspecified: Secondary | ICD-10-CM | POA: Diagnosis not present

## 2024-02-02 DIAGNOSIS — R933 Abnormal findings on diagnostic imaging of other parts of digestive tract: Secondary | ICD-10-CM

## 2024-02-02 MED ORDER — PEG 3350-KCL-NABCB-NACL-NASULF 236 G PO SOLR: 4000.0000 mL | Freq: Once | ORAL | 0 refills | Status: AC

## 2024-02-02 MED ORDER — METOCLOPRAMIDE HCL 5 MG PO TABS: ORAL_TABLET | ORAL | 0 refills | Status: AC

## 2024-02-02 NOTE — Progress Notes (Signed)
 "     [facility] Consult Note:  History: Christian Brooks 02/02/2024  Referring provider: Ransom Other, MD  Reason for consult/chief complaint: Colonoscopy (Patient stated that he is here to establish care and to discuss having a colonoscopy.)   Subjective  Prior history:  Previous colonoscopies at outside practices as described below   Discussed the use of AI scribe software for clinical note transcription with the patient, who gave verbal consent to proceed.  History of Present Illness Christian Brooks is a 74 year old male with a history of multiple colonic polyps and prior polypectomies who presents for polyp surveillance and colonoscopy follow-up.  Colonic Polyps and Surveillance: Reports starting colonoscopies in his 31s. -Recalls more recent exams with 2018 incomplete colonoscopy in [place]  due to long, redundant colon - Subsequent colonoscopies identified and removed multiple lesions, including a significant polyp at [facility] with appropriate healing on follow-up - November 2021 colonoscopy [facility] showed suboptimal bowel preparation and multiple polyps, including a large, carpet-like polyp in the ascending colon - Underwent endoscopic mucosal resection and ablation of residual tissue at [facility] in December 2021.  Advised to return in 1 year - December 2022 follow-up colonoscopy identified a total of nine removed; biopsies from a bumpy area at the previous polypectomy site showed no clear residual polyp visibly, but none of those pathology reports are available to us  today. - Surveillance interval of three years recommended and now due - CT urogram in November 2025 for hematuria and prostate cancer showed a 5 cm segment of mild wall thickening in the transverse colon at the splenic flexure, possibly true abnormality but possibly underdistention/peristalsis based on radiology report.  (Report initially difficult to find, but was discovered through  the backdoor of epic imaging reports since this was done at [facility] urology but read by our local radiology practice)  Bowel Habits and Gastrointestinal Symptoms: - Bowel habits stable - Uses Metamucil almost daily and has increased water intake - No issues with bowel movements - No blood in stool, heartburn, dysphagia, nausea, vomiting, or appetite changes - Feels well from a digestive standpoint with no concerning symptoms since last procedures  Hemorrhoidal Disease: - Persistent hemorrhoids after prior surgery for severe external hemorrhoids - Residual perineal swelling worsens with prolonged standing - No significant discomfort or bleeding   ROS:  Review of Systems  He denies chest pain dyspnea or dysuria  Past Medical History: Past Medical History:  Diagnosis Date   Arthritis    Cancer determined by prostate biopsy [facility])      Past Surgical History: History reviewed. No pertinent surgical history.   Family History: History reviewed. No pertinent family history.  Social History: Social History   Socioeconomic History   Marital status: Married    Spouse name: Not on file   Number of children: Not on file   Years of education: Not on file   Highest education level: Not on file  Occupational History   Not on file  Tobacco Use   Smoking status: Never   Smokeless tobacco: Never  Vaping Use   Vaping status: Never Used  Substance and Sexual Activity   Alcohol use: Not on file   Drug use: Never   Sexual activity: Not on file  Other Topics Concern   Not on file  Social History Narrative   Not on file   Social Drivers of Health   Tobacco Use: Low Risk (02/02/2024)   Patient History    Smoking  Tobacco Use: Never    Smokeless Tobacco Use: Never    Passive Exposure: Not on file  Financial Resource Strain: Not on file  Food Insecurity: Not on file  Transportation Needs: Not on file  Physical Activity: Not on file  Stress: Not on file  Social  Connections: Not on file  Depression (EYV7-0): Not on file  Alcohol Screen: Not on file  Housing: Not on file  Utilities: Not on file  Health Literacy: Not on file   His wife Christian Brooks is a patient of mine, and he wanted to transfer his care to me  Allergies: Allergies[1]  Outpatient Meds: Current Outpatient Medications  Medication Sig Dispense Refill   amLODipine (NORVASC) 10 MG tablet Take 10 mg by mouth daily.     atorvastatin (LIPITOR) 10 MG tablet Take 10 mg by mouth daily. (Patient taking differently: Take 20 mg by mouth daily.)     losartan-hydrochlorothiazide (HYZAAR) 50-12.5 MG tablet Take 1 tablet by mouth daily.     metoCLOPramide  (REGLAN ) 5 MG tablet Take one tablet 45 minutes before drinking colonoscopy prep 2 tablet 0   polyethylene glycol (GOLYTELY) 236 g solution Take 4,000 mLs by mouth once for 1 dose. 4000 mL 0   silodosin (RAPAFLO) 4 MG CAPS capsule Silodosin     No current facility-administered medications for this visit.      ___________________________________________________________________ Objective   Exam:  BP 124/70   Pulse 68   Ht 6' (1.829 m)   Wt 228 lb 4 oz (103.5 kg)   BMI 30.96 kg/m  Wt Readings from Last 3 Encounters:  02/02/24 228 lb 4 oz (103.5 kg)    General: Well-appearing Head and neck exam unremarkable Cardiovascular regular without appreciable murmur.  Trace bilateral edema Lungs clear bilaterally with good air entry and no wheezing, good respiratory effort Abdomen soft nondistended with normal bowel sounds, no bruit, no tenderness or hepatosplenomegaly Normal gross motor function and fluent speech   Labs: Previous pathology results as described above.  Patient had paper copies of those reports for me to review today, and we will have them scanned/filed to his chart.  Encounter Diagnoses  Name Primary?   Abnormal finding on GI tract imaging Yes   Hx of colonic polyps      Assessment & Plan Colonic polyps Increased risk  for recurrent or high-risk polyps due to rapid development and prior advanced resection (EMR). Possible polyposis syndrome and suboptimal bowel preparation due to redundant colon anatomy necessitate close surveillance. - Obtained prior procedure reports for review. - Scheduled colonoscopy for polyp surveillance in hospital endoscopy department. - Recommended larger volume, split-dose bowel preparation.  Although there was no evidence of polyp recurrence at the EMR site on the December 2022 exam, that was 3 years ago.  As such, I recommended that we do the procedure in the hospital outpatient endoscopy department in the event that there are any lesions found requiring EMR so as to hopefully avoid a second procedure.  (Certain findings might not be amenable to devices and techniques we have available in our office-based endoscopy department) In addition, I recommended a split dose GoLytely prep because he previously had a reported suboptimal prep and has a known redundant colon.  Findings on the CT scan sound mild and nonspecific, though I understand why he is concerned about that and he does need endoscopic evaluation.  I offered him the option to do colonoscopy in our office-based endoscopy lab because I could not get that done within the next  few weeks rather than my next available hospital outpatient slot at the end of March.  After further discussion, he opted for the hospital endoscopy slot in late March.  If we get something open sooner at the hospital, then we may contact him to move it up.  He was agreeable after thorough discussion of procedure and risks.  The benefits and risks of the planned procedure(s) were described in detail with the patient or (when appropriate) their health care proxy.  Risks were outlined as including, but not limited to, bleeding, infection, perforation, adverse medication reaction leading to cardiac or pulmonary decompensation, pancreatitis (if ERCP).  The limitation of  incomplete mucosal visualization was also discussed.  No guarantees or warranties were given.    Thank you for the courtesy of this consult.  Please call me with any questions or concerns.  Victory LITTIE Brand III  CC: Referring provider noted above      [1]  Allergies Allergen Reactions   Bee Venom Nausea And Vomiting   "

## 2024-02-02 NOTE — Patient Instructions (Addendum)
 You have been scheduled for a colonoscopy. Please follow written instructions given to you at your visit today.   If you use inhalers (even only as needed), please bring them with you on the day of your procedure.  DO NOT TAKE 7 DAYS PRIOR TO TEST- Trulicity (dulaglutide) Ozempic, Wegovy (semaglutide) Mounjaro, Zepbound (tirzepatide) Bydureon Bcise (exanatide extended release)  DO NOT TAKE 1 DAY PRIOR TO YOUR TEST Rybelsus (semaglutide) Adlyxin (lixisenatide) Victoza (liraglutide) Byetta (exanatide) ___________________________________________________________________________  Thank you for trusting me with your gastrointestinal care!    Dr. Victory Legrand [facility]

## 2024-04-04 ENCOUNTER — Encounter (HOSPITAL_COMMUNITY): Payer: Self-pay

## 2024-04-04 ENCOUNTER — Ambulatory Visit (HOSPITAL_COMMUNITY): Admit: 2024-04-04 | Admitting: Gastroenterology

## 2024-04-04 SURGERY — COLONOSCOPY
Anesthesia: Monitor Anesthesia Care
